# Patient Record
Sex: Female | Born: 1950 | Race: White | Hispanic: No | Marital: Married | Smoking: Former smoker
Health system: Southern US, Community
[De-identification: ages and names within clinical notes are randomized; demographics above are authoritative.]

## PROBLEM LIST (undated history)

## (undated) DIAGNOSIS — Z923 Personal history of irradiation: Secondary | ICD-10-CM

## (undated) DIAGNOSIS — C439 Malignant melanoma of skin, unspecified: Secondary | ICD-10-CM

## (undated) DIAGNOSIS — M1990 Unspecified osteoarthritis, unspecified site: Secondary | ICD-10-CM

## (undated) DIAGNOSIS — E785 Hyperlipidemia, unspecified: Secondary | ICD-10-CM

## (undated) DIAGNOSIS — K219 Gastro-esophageal reflux disease without esophagitis: Secondary | ICD-10-CM

## (undated) DIAGNOSIS — R42 Dizziness and giddiness: Secondary | ICD-10-CM

## (undated) DIAGNOSIS — Z9889 Other specified postprocedural states: Secondary | ICD-10-CM

## (undated) HISTORY — DX: Gastro-esophageal reflux disease without esophagitis: K21.9

## (undated) HISTORY — PX: ANAL FISSURE REPAIR: SHX2312

## (undated) HISTORY — PX: HEMORRHOIDECTOMY WITH HEMORRHOID BANDING: SHX5633

## (undated) HISTORY — DX: Unspecified osteoarthritis, unspecified site: M19.90

## (undated) HISTORY — PX: AUGMENTATION MAMMAPLASTY: SUR837

## (undated) HISTORY — DX: Malignant melanoma of skin, unspecified: C43.9

---

## 1997-09-01 ENCOUNTER — Other Ambulatory Visit: Admission: RE | Admit: 1997-09-01 | Discharge: 1997-09-01 | Payer: Self-pay | Admitting: Gynecology

## 1998-09-26 ENCOUNTER — Other Ambulatory Visit: Admission: RE | Admit: 1998-09-26 | Discharge: 1998-09-26 | Payer: Self-pay | Admitting: Gynecology

## 1998-10-19 ENCOUNTER — Other Ambulatory Visit: Admission: RE | Admit: 1998-10-19 | Discharge: 1998-10-19 | Payer: Self-pay | Admitting: Gynecology

## 1998-11-13 ENCOUNTER — Ambulatory Visit (HOSPITAL_COMMUNITY): Admission: RE | Admit: 1998-11-13 | Discharge: 1998-11-13 | Payer: Self-pay | Admitting: Gynecology

## 1999-02-21 ENCOUNTER — Other Ambulatory Visit: Admission: RE | Admit: 1999-02-21 | Discharge: 1999-02-21 | Payer: Self-pay | Admitting: Gynecology

## 1999-05-11 ENCOUNTER — Encounter: Admission: RE | Admit: 1999-05-11 | Discharge: 1999-05-11 | Payer: Self-pay | Admitting: Gynecology

## 1999-05-11 ENCOUNTER — Encounter: Payer: Self-pay | Admitting: Gynecology

## 1999-08-20 ENCOUNTER — Other Ambulatory Visit: Admission: RE | Admit: 1999-08-20 | Discharge: 1999-08-20 | Payer: Self-pay | Admitting: Gynecology

## 2000-03-03 ENCOUNTER — Other Ambulatory Visit: Admission: RE | Admit: 2000-03-03 | Discharge: 2000-03-03 | Payer: Self-pay | Admitting: Gynecology

## 2000-05-12 ENCOUNTER — Encounter: Admission: RE | Admit: 2000-05-12 | Discharge: 2000-05-12 | Payer: Self-pay | Admitting: Gynecology

## 2000-05-12 ENCOUNTER — Encounter: Payer: Self-pay | Admitting: Gynecology

## 2001-02-17 ENCOUNTER — Other Ambulatory Visit: Admission: RE | Admit: 2001-02-17 | Discharge: 2001-02-17 | Payer: Self-pay | Admitting: Gynecology

## 2001-04-23 ENCOUNTER — Ambulatory Visit (HOSPITAL_COMMUNITY): Admission: RE | Admit: 2001-04-23 | Discharge: 2001-04-23 | Payer: Self-pay | Admitting: General Surgery

## 2001-04-23 ENCOUNTER — Encounter (INDEPENDENT_AMBULATORY_CARE_PROVIDER_SITE_OTHER): Payer: Self-pay | Admitting: Specialist

## 2001-05-29 ENCOUNTER — Encounter: Payer: Self-pay | Admitting: Gynecology

## 2001-05-29 ENCOUNTER — Encounter: Admission: RE | Admit: 2001-05-29 | Discharge: 2001-05-29 | Payer: Self-pay | Admitting: Gynecology

## 2001-06-15 ENCOUNTER — Encounter: Payer: Self-pay | Admitting: Family Medicine

## 2001-06-15 ENCOUNTER — Ambulatory Visit (HOSPITAL_COMMUNITY): Admission: RE | Admit: 2001-06-15 | Discharge: 2001-06-15 | Payer: Self-pay | Admitting: Family Medicine

## 2002-03-01 ENCOUNTER — Other Ambulatory Visit: Admission: RE | Admit: 2002-03-01 | Discharge: 2002-03-01 | Payer: Self-pay | Admitting: Gynecology

## 2002-05-31 ENCOUNTER — Encounter: Payer: Self-pay | Admitting: Gynecology

## 2002-05-31 ENCOUNTER — Encounter: Admission: RE | Admit: 2002-05-31 | Discharge: 2002-05-31 | Payer: Self-pay | Admitting: Gynecology

## 2003-03-03 ENCOUNTER — Other Ambulatory Visit: Admission: RE | Admit: 2003-03-03 | Discharge: 2003-03-03 | Payer: Self-pay | Admitting: Gynecology

## 2003-06-02 ENCOUNTER — Encounter: Admission: RE | Admit: 2003-06-02 | Discharge: 2003-06-02 | Payer: Self-pay | Admitting: Gynecology

## 2004-03-05 ENCOUNTER — Other Ambulatory Visit: Admission: RE | Admit: 2004-03-05 | Discharge: 2004-03-05 | Payer: Self-pay | Admitting: Gynecology

## 2004-06-14 ENCOUNTER — Ambulatory Visit (HOSPITAL_COMMUNITY): Admission: RE | Admit: 2004-06-14 | Discharge: 2004-06-14 | Payer: Self-pay | Admitting: Gynecology

## 2005-03-07 ENCOUNTER — Other Ambulatory Visit: Admission: RE | Admit: 2005-03-07 | Discharge: 2005-03-07 | Payer: Self-pay | Admitting: Gynecology

## 2005-07-17 ENCOUNTER — Ambulatory Visit (HOSPITAL_COMMUNITY): Admission: RE | Admit: 2005-07-17 | Discharge: 2005-07-17 | Payer: Self-pay | Admitting: Gynecology

## 2006-07-23 ENCOUNTER — Ambulatory Visit (HOSPITAL_COMMUNITY): Admission: RE | Admit: 2006-07-23 | Discharge: 2006-07-23 | Payer: Self-pay | Admitting: Gynecology

## 2007-01-21 ENCOUNTER — Encounter: Admission: RE | Admit: 2007-01-21 | Discharge: 2007-04-14 | Payer: Self-pay | Admitting: Family Medicine

## 2007-07-28 ENCOUNTER — Ambulatory Visit (HOSPITAL_COMMUNITY): Admission: RE | Admit: 2007-07-28 | Discharge: 2007-07-28 | Payer: Self-pay | Admitting: Gynecology

## 2007-08-07 ENCOUNTER — Encounter: Admission: RE | Admit: 2007-08-07 | Discharge: 2007-08-07 | Payer: Self-pay | Admitting: Gynecology

## 2008-07-28 ENCOUNTER — Encounter: Admission: RE | Admit: 2008-07-28 | Discharge: 2008-07-28 | Payer: Self-pay | Admitting: Gynecology

## 2008-08-05 ENCOUNTER — Emergency Department (HOSPITAL_BASED_OUTPATIENT_CLINIC_OR_DEPARTMENT_OTHER): Admission: EM | Admit: 2008-08-05 | Discharge: 2008-08-05 | Payer: Self-pay | Admitting: Emergency Medicine

## 2008-08-05 ENCOUNTER — Ambulatory Visit: Payer: Self-pay | Admitting: Diagnostic Radiology

## 2008-08-15 ENCOUNTER — Encounter: Admission: RE | Admit: 2008-08-15 | Discharge: 2008-08-15 | Payer: Self-pay | Admitting: Family Medicine

## 2008-09-06 ENCOUNTER — Encounter: Admission: RE | Admit: 2008-09-06 | Discharge: 2008-09-06 | Payer: Self-pay | Admitting: Family Medicine

## 2009-08-01 ENCOUNTER — Encounter: Admission: RE | Admit: 2009-08-01 | Discharge: 2009-08-01 | Payer: Self-pay | Admitting: Gynecology

## 2010-06-10 DIAGNOSIS — C439 Malignant melanoma of skin, unspecified: Secondary | ICD-10-CM

## 2010-06-10 HISTORY — DX: Malignant melanoma of skin, unspecified: C43.9

## 2010-07-12 ENCOUNTER — Other Ambulatory Visit: Payer: Self-pay | Admitting: Gynecology

## 2010-07-12 DIAGNOSIS — Z1239 Encounter for other screening for malignant neoplasm of breast: Secondary | ICD-10-CM

## 2010-07-13 ENCOUNTER — Other Ambulatory Visit: Payer: Self-pay | Admitting: Dermatology

## 2010-08-03 ENCOUNTER — Ambulatory Visit
Admission: RE | Admit: 2010-08-03 | Discharge: 2010-08-03 | Disposition: A | Discharge status: Home / Self Care | Payer: PRIVATE HEALTH INSURANCE | Source: Ambulatory Visit | Attending: Gynecology | Admitting: Gynecology

## 2010-08-03 ENCOUNTER — Ambulatory Visit: Payer: Self-pay

## 2010-08-03 DIAGNOSIS — Z1239 Encounter for other screening for malignant neoplasm of breast: Secondary | ICD-10-CM

## 2010-08-09 ENCOUNTER — Other Ambulatory Visit: Payer: Self-pay | Admitting: Dermatology

## 2010-09-25 LAB — CBC
Hemoglobin: 13.5 g/dL (ref 12.0–15.0)
MCHC: 34 g/dL (ref 30.0–36.0)
RBC: 4.36 MIL/uL (ref 3.87–5.11)
RDW: 11.9 % (ref 11.5–15.5)
WBC: 5.2 10*3/uL (ref 4.0–10.5)

## 2010-09-25 LAB — D-DIMER, QUANTITATIVE: D-Dimer, Quant: <0.22 ug/mL-FEU (ref 0.00–0.48)

## 2010-09-25 LAB — COMPREHENSIVE METABOLIC PANEL
ALT: 23 U/L (ref 0–35)
AST: 24 U/L (ref 0–37)
Chloride: 103 mEq/L (ref 96–112)
Creatinine, Ser: 0.9 mg/dL (ref 0.4–1.2)
GFR calc non Af Amer: >60 mL/min (ref >60)
Glucose, Bld: 95 mg/dL (ref 70–99)
Potassium: 3.8 mEq/L (ref 3.5–5.1)
Sodium: 141 mEq/L (ref 135–145)
Total Bilirubin: 0.8 mg/dL (ref 0.3–1.2)

## 2010-09-25 LAB — POCT CARDIAC MARKERS
Myoglobin, poc: 81.2 ng/mL (ref 12–200)
Troponin i, poc: 0.06 ng/mL (ref 0.00–0.09)

## 2010-09-25 LAB — DIFFERENTIAL
Lymphocytes Relative: 34 % (ref 12–46)
Monocytes Relative: 7 % (ref 3–12)
Neutrophils Relative %: 58 % (ref 43–77)

## 2010-09-28 ENCOUNTER — Other Ambulatory Visit: Payer: Self-pay | Admitting: Dermatology

## 2010-10-26 NOTE — Op Note (Signed)
[facility]  Patient:    Diane Obrien, Diane Obrien Visit Number: [id number] MRN: [id number]          Service Type: DSU Location: DAY Attending Physician:  Carson Myrtle Dictated by:   Sheppard Plumber Earlene Plater, M.D. Proc. Date: 04/23/01 Admit Date:  04/23/2001 Discharge Date: 04/23/2001                             Operative Report  PREOPERATIVE DIAGNOSIS:  History of anal fissure, external anal tags.  POSTOPERATIVE DIAGNOSIS:  Anal fissure, external tags.  OPERATION PERFORMED:  Repair of anal fissure and excision of tags.  SURGEON:  Timothy E. Earlene Plater, M.D.  ANESTHESIA:  General.  INDICATIONS FOR PROCEDURE:  Ms. Elizondo is a patient I have seen and followed for many years.  She has previously had excision of polyps and repair of anal fissure.  She presented approximately a month ago with recurrence of the fissure and exaggeration of the external tags.  She has been treated conservatively without much benefit.  She is ready now for definitive procedure.  She agrees and understands.  DESCRIPTION OF PROCEDURE:  The patient was taken to the operating room and placed supine.  General mask anesthesia provided.  She was placed in lithotomy position, perianal area inspected, prepped and draped.  A large right anterior external tag was present.  A larger posterior tag was present and in it lay a posterior anal fissure.  The anal orifice was adequate but small.  Sphincters were intact.  The anus was injected round and about with 0.25% Marcaine with epinephrine mixed 9:1 with Wydase.  This was massaged in well.  The external tags were carefully removed.  A left posterior internal sphincterotomy carefully accomplished percutaneously and the external sphincter was left intact.  The fissure was cauterized and the site of tag  excision was closed with running or interrupted 3-0 chromic.  This completed the procedure.  There were no complications and no bleeding.  Gelfoam  gauze and a dry sterile dressing applied.  She tolerated it well, was awakened and taken to the recovery room in good condition.  Written and verbal instructions were given including Percocet and she will be followed as an outpatient. Dictated by:   Sheppard Plumber Earlene Plater, M.D. Attending Physician:  Carson Myrtle DD:  04/23/01 TD:  04/23/01 Job: [id number]

## 2011-03-05 ENCOUNTER — Other Ambulatory Visit: Payer: Self-pay | Admitting: Gynecology

## 2011-08-23 ENCOUNTER — Other Ambulatory Visit: Payer: Self-pay | Admitting: Gynecology

## 2011-08-23 DIAGNOSIS — Z1231 Encounter for screening mammogram for malignant neoplasm of breast: Secondary | ICD-10-CM

## 2011-09-04 ENCOUNTER — Ambulatory Visit
Admission: RE | Admit: 2011-09-04 | Discharge: 2011-09-04 | Disposition: A | Discharge status: Home / Self Care | Payer: Federal, State, Local not specified - PPO | Source: Ambulatory Visit | Attending: Gynecology | Admitting: Gynecology

## 2011-09-04 DIAGNOSIS — Z1231 Encounter for screening mammogram for malignant neoplasm of breast: Secondary | ICD-10-CM

## 2012-04-07 ENCOUNTER — Encounter: Payer: Self-pay | Admitting: Gastroenterology

## 2012-05-04 ENCOUNTER — Other Ambulatory Visit: Payer: Self-pay | Admitting: Gynecology

## 2012-05-10 HISTORY — PX: COLONOSCOPY: SHX174

## 2012-05-14 ENCOUNTER — Ambulatory Visit (AMBULATORY_SURGERY_CENTER): Payer: Federal, State, Local not specified - PPO | Admitting: *Deleted

## 2012-05-14 ENCOUNTER — Encounter: Payer: Self-pay | Admitting: Gastroenterology

## 2012-05-14 VITALS — Ht 64.0 in | Wt 159.2 lb

## 2012-05-14 DIAGNOSIS — Z1211 Encounter for screening for malignant neoplasm of colon: Secondary | ICD-10-CM

## 2012-05-14 MED ORDER — NA SULFATE-K SULFATE-MG SULF 17.5-3.13-1.6 GM/177ML PO SOLN: ORAL | Status: DC

## 2012-05-28 ENCOUNTER — Encounter: Payer: Self-pay | Admitting: Gastroenterology

## 2012-05-28 ENCOUNTER — Ambulatory Visit (AMBULATORY_SURGERY_CENTER): Payer: Federal, State, Local not specified - PPO | Admitting: Gastroenterology

## 2012-05-28 VITALS — BP 143/78 | HR 73 | Temp 97.3°F | Resp 15 | Ht 64.0 in | Wt 159.0 lb

## 2012-05-28 DIAGNOSIS — Z1211 Encounter for screening for malignant neoplasm of colon: Secondary | ICD-10-CM

## 2012-05-28 DIAGNOSIS — K648 Other hemorrhoids: Secondary | ICD-10-CM

## 2012-05-28 MED ORDER — SODIUM CHLORIDE 0.9 % IV SOLN: 500.0000 mL | INTRAVENOUS | Status: DC

## 2012-05-28 NOTE — Patient Instructions (Addendum)

## 2012-05-28 NOTE — Progress Notes (Addendum)
Patient did not have preoperative order for IV antibiotic SSI prophylaxis. (G8918)  Patient did not experience any of the following events: a burn prior to discharge; a fall within the facility; wrong site/side/patient/procedure/implant event; or a hospital transfer or hospital admission upon discharge from the facility. (G8907)  

## 2012-05-28 NOTE — Progress Notes (Signed)
Propofol given over incremental dosages 

## 2012-05-28 NOTE — Op Note (Signed)
[facility] [address] [place], [address]   COLONOSCOPY PROCEDURE REPORT  PATIENT: Diane, Obrien.  MR#: [id number] BIRTHDATE: 24-Jan-1951 , 61  yrs. old GENDER: Female ENDOSCOPIST: Louis Meckel, MD REFERRED XB:JYNWG Swayne, M.D. PROCEDURE DATE:  05/28/2012 PROCEDURE:   Colonoscopy, diagnostic ASA CLASS:   Class II INDICATIONS:average risk screening. MEDICATIONS: MAC sedation, administered by CRNA and propofol (Diprivan) 100mg  IV  DESCRIPTION OF PROCEDURE:   After the risks benefits and alternatives of the procedure were thoroughly explained, informed consent was obtained.  A digital rectal exam revealed internal hemorrhoids.   The LB [id number]  endoscope was introduced through the anus and advanced to the cecum, which was identified by both the appendix and ileocecal valve. No adverse events experienced.   The quality of the prep was Suprep excellent  The instrument was then slowly withdrawn as the colon was fully examined.      COLON FINDINGS: Internal hemorrhoids were found.   The colon mucosa was otherwise normal.  Retroflexed views revealed no abnormalities. The time to cecum=2 minutes 58 seconds.  Withdrawal time=6 minutes 0 seconds.  The scope was withdrawn and the procedure completed. COMPLICATIONS: There were no complications.  ENDOSCOPIC IMPRESSION: 1.   Internal hemorrhoids 2.   The colon mucosa was otherwise normal  RECOMMENDATIONS: Continue current colorectal screening recommendations for "routine risk" patients with a repeat colonoscopy in 10 years.   eSigned:  Louis Meckel, MD 05/28/2012 9:26 AM   cc:

## 2012-05-29 ENCOUNTER — Telehealth: Payer: Self-pay | Admitting: *Deleted

## 2012-05-29 NOTE — Telephone Encounter (Signed)
  Follow up Call-  Call back number 05/28/2012  Post procedure Call Back phone  # [phone]  Permission to leave phone message Yes     Patient questions:  Do you have a fever, pain , or abdominal swelling? no Pain Score  0 *  Have you tolerated food without any problems? yes  Have you been able to return to your normal activities? yes  Do you have any questions about your discharge instructions: Diet   no Medications  no Follow up visit  no  Do you have questions or concerns about your Care? no  Actions: * If pain score is 4 or above: No action needed, pain <4.

## 2012-08-13 ENCOUNTER — Other Ambulatory Visit: Payer: Self-pay

## 2012-09-08 ENCOUNTER — Ambulatory Visit
Admission: RE | Admit: 2012-09-08 | Discharge: 2012-09-08 | Disposition: A | Discharge status: Home / Self Care | Payer: Federal, State, Local not specified - PPO | Source: Ambulatory Visit

## 2012-09-08 DIAGNOSIS — Z1231 Encounter for screening mammogram for malignant neoplasm of breast: Secondary | ICD-10-CM

## 2013-02-02 ENCOUNTER — Other Ambulatory Visit: Payer: Self-pay | Admitting: Dermatology

## 2013-05-17 ENCOUNTER — Other Ambulatory Visit: Payer: Self-pay | Admitting: Gynecology

## 2013-08-04 ENCOUNTER — Other Ambulatory Visit: Payer: Self-pay | Admitting: Dermatology

## 2013-09-07 ENCOUNTER — Other Ambulatory Visit: Payer: Self-pay | Admitting: Dermatology

## 2013-09-10 ENCOUNTER — Other Ambulatory Visit: Payer: Self-pay

## 2013-09-10 DIAGNOSIS — Z1231 Encounter for screening mammogram for malignant neoplasm of breast: Secondary | ICD-10-CM

## 2013-09-23 ENCOUNTER — Ambulatory Visit
Admission: RE | Admit: 2013-09-23 | Discharge: 2013-09-23 | Disposition: A | Discharge status: Home / Self Care | Payer: Federal, State, Local not specified - PPO | Source: Ambulatory Visit

## 2013-09-23 DIAGNOSIS — Z1231 Encounter for screening mammogram for malignant neoplasm of breast: Secondary | ICD-10-CM

## 2014-09-14 ENCOUNTER — Other Ambulatory Visit: Payer: Self-pay

## 2014-09-14 DIAGNOSIS — Z1231 Encounter for screening mammogram for malignant neoplasm of breast: Secondary | ICD-10-CM

## 2014-10-11 ENCOUNTER — Encounter (INDEPENDENT_AMBULATORY_CARE_PROVIDER_SITE_OTHER): Payer: Self-pay

## 2014-10-11 ENCOUNTER — Ambulatory Visit
Admission: RE | Admit: 2014-10-11 | Discharge: 2014-10-11 | Disposition: A | Discharge status: Home / Self Care | Payer: Federal, State, Local not specified - PPO | Source: Ambulatory Visit

## 2014-10-11 ENCOUNTER — Other Ambulatory Visit: Payer: Self-pay

## 2014-10-11 DIAGNOSIS — Z1231 Encounter for screening mammogram for malignant neoplasm of breast: Secondary | ICD-10-CM

## 2015-07-27 DIAGNOSIS — M9903 Segmental and somatic dysfunction of lumbar region: Secondary | ICD-10-CM | POA: Diagnosis not present

## 2015-07-27 DIAGNOSIS — M9901 Segmental and somatic dysfunction of cervical region: Secondary | ICD-10-CM | POA: Diagnosis not present

## 2015-07-27 DIAGNOSIS — M5383 Other specified dorsopathies, cervicothoracic region: Secondary | ICD-10-CM | POA: Diagnosis not present

## 2015-07-27 DIAGNOSIS — M9902 Segmental and somatic dysfunction of thoracic region: Secondary | ICD-10-CM | POA: Diagnosis not present

## 2015-07-27 DIAGNOSIS — M5033 Other cervical disc degeneration, cervicothoracic region: Secondary | ICD-10-CM | POA: Diagnosis not present

## 2015-07-27 DIAGNOSIS — M5116 Intervertebral disc disorders with radiculopathy, lumbar region: Secondary | ICD-10-CM | POA: Diagnosis not present

## 2015-08-10 DIAGNOSIS — Z0000 Encounter for general adult medical examination without abnormal findings: Secondary | ICD-10-CM | POA: Diagnosis not present

## 2015-09-22 DIAGNOSIS — J0100 Acute maxillary sinusitis, unspecified: Secondary | ICD-10-CM | POA: Diagnosis not present

## 2015-09-26 ENCOUNTER — Other Ambulatory Visit: Payer: Self-pay

## 2015-09-26 DIAGNOSIS — Z1231 Encounter for screening mammogram for malignant neoplasm of breast: Secondary | ICD-10-CM

## 2015-10-05 ENCOUNTER — Other Ambulatory Visit: Payer: Self-pay | Admitting: Family Medicine

## 2015-10-05 DIAGNOSIS — R9389 Abnormal findings on diagnostic imaging of other specified body structures: Secondary | ICD-10-CM

## 2015-10-05 DIAGNOSIS — R030 Elevated blood-pressure reading, without diagnosis of hypertension: Secondary | ICD-10-CM | POA: Diagnosis not present

## 2015-10-05 DIAGNOSIS — R938 Abnormal findings on diagnostic imaging of other specified body structures: Secondary | ICD-10-CM | POA: Diagnosis not present

## 2015-10-10 ENCOUNTER — Ambulatory Visit
Admission: RE | Admit: 2015-10-10 | Discharge: 2015-10-10 | Disposition: A | Discharge status: Home / Self Care | Payer: Medicare Other | Source: Ambulatory Visit | Attending: Family Medicine | Admitting: Family Medicine

## 2015-10-10 DIAGNOSIS — Z0389 Encounter for observation for other suspected diseases and conditions ruled out: Secondary | ICD-10-CM | POA: Diagnosis not present

## 2015-10-10 DIAGNOSIS — R9389 Abnormal findings on diagnostic imaging of other specified body structures: Secondary | ICD-10-CM

## 2015-10-26 ENCOUNTER — Ambulatory Visit
Admission: RE | Admit: 2015-10-26 | Discharge: 2015-10-26 | Disposition: A | Discharge status: Home / Self Care | Payer: Federal, State, Local not specified - PPO | Source: Ambulatory Visit

## 2015-10-26 ENCOUNTER — Ambulatory Visit: Payer: Federal, State, Local not specified - PPO

## 2015-10-26 DIAGNOSIS — Z1231 Encounter for screening mammogram for malignant neoplasm of breast: Secondary | ICD-10-CM | POA: Diagnosis not present

## 2015-11-07 DIAGNOSIS — D2272 Melanocytic nevi of left lower limb, including hip: Secondary | ICD-10-CM | POA: Diagnosis not present

## 2015-11-07 DIAGNOSIS — Z86018 Personal history of other benign neoplasm: Secondary | ICD-10-CM | POA: Diagnosis not present

## 2015-11-07 DIAGNOSIS — D2271 Melanocytic nevi of right lower limb, including hip: Secondary | ICD-10-CM | POA: Diagnosis not present

## 2015-11-07 DIAGNOSIS — Z87898 Personal history of other specified conditions: Secondary | ICD-10-CM | POA: Diagnosis not present

## 2015-11-07 DIAGNOSIS — D225 Melanocytic nevi of trunk: Secondary | ICD-10-CM | POA: Diagnosis not present

## 2015-11-07 DIAGNOSIS — Z808 Family history of malignant neoplasm of other organs or systems: Secondary | ICD-10-CM | POA: Diagnosis not present

## 2015-11-07 DIAGNOSIS — D2262 Melanocytic nevi of left upper limb, including shoulder: Secondary | ICD-10-CM | POA: Diagnosis not present

## 2015-11-07 DIAGNOSIS — Z85828 Personal history of other malignant neoplasm of skin: Secondary | ICD-10-CM | POA: Diagnosis not present

## 2015-11-07 DIAGNOSIS — D485 Neoplasm of uncertain behavior of skin: Secondary | ICD-10-CM | POA: Diagnosis not present

## 2015-11-22 DIAGNOSIS — M5033 Other cervical disc degeneration, cervicothoracic region: Secondary | ICD-10-CM | POA: Diagnosis not present

## 2015-11-22 DIAGNOSIS — M9903 Segmental and somatic dysfunction of lumbar region: Secondary | ICD-10-CM | POA: Diagnosis not present

## 2015-11-22 DIAGNOSIS — M9902 Segmental and somatic dysfunction of thoracic region: Secondary | ICD-10-CM | POA: Diagnosis not present

## 2015-11-22 DIAGNOSIS — M9901 Segmental and somatic dysfunction of cervical region: Secondary | ICD-10-CM | POA: Diagnosis not present

## 2015-11-22 DIAGNOSIS — M5116 Intervertebral disc disorders with radiculopathy, lumbar region: Secondary | ICD-10-CM | POA: Diagnosis not present

## 2015-11-22 DIAGNOSIS — M5383 Other specified dorsopathies, cervicothoracic region: Secondary | ICD-10-CM | POA: Diagnosis not present

## 2015-12-05 ENCOUNTER — Other Ambulatory Visit: Payer: Self-pay | Admitting: Obstetrics and Gynecology

## 2015-12-05 DIAGNOSIS — Z124 Encounter for screening for malignant neoplasm of cervix: Secondary | ICD-10-CM | POA: Diagnosis not present

## 2015-12-07 LAB — CYTOLOGY - PAP

## 2016-02-23 DIAGNOSIS — Z0000 Encounter for general adult medical examination without abnormal findings: Secondary | ICD-10-CM | POA: Diagnosis not present

## 2016-02-23 DIAGNOSIS — Z1211 Encounter for screening for malignant neoplasm of colon: Secondary | ICD-10-CM | POA: Diagnosis not present

## 2016-02-23 DIAGNOSIS — Z23 Encounter for immunization: Secondary | ICD-10-CM | POA: Diagnosis not present

## 2016-02-23 DIAGNOSIS — Z87898 Personal history of other specified conditions: Secondary | ICD-10-CM | POA: Diagnosis not present

## 2016-02-23 DIAGNOSIS — K219 Gastro-esophageal reflux disease without esophagitis: Secondary | ICD-10-CM | POA: Diagnosis not present

## 2016-02-23 DIAGNOSIS — E782 Mixed hyperlipidemia: Secondary | ICD-10-CM | POA: Diagnosis not present

## 2016-02-23 DIAGNOSIS — E042 Nontoxic multinodular goiter: Secondary | ICD-10-CM | POA: Diagnosis not present

## 2016-02-23 DIAGNOSIS — Z1389 Encounter for screening for other disorder: Secondary | ICD-10-CM | POA: Diagnosis not present

## 2016-02-23 DIAGNOSIS — Z85820 Personal history of malignant melanoma of skin: Secondary | ICD-10-CM | POA: Diagnosis not present

## 2016-02-23 DIAGNOSIS — R030 Elevated blood-pressure reading, without diagnosis of hypertension: Secondary | ICD-10-CM | POA: Diagnosis not present

## 2016-03-13 DIAGNOSIS — M5116 Intervertebral disc disorders with radiculopathy, lumbar region: Secondary | ICD-10-CM | POA: Diagnosis not present

## 2016-03-13 DIAGNOSIS — M9902 Segmental and somatic dysfunction of thoracic region: Secondary | ICD-10-CM | POA: Diagnosis not present

## 2016-03-13 DIAGNOSIS — M5383 Other specified dorsopathies, cervicothoracic region: Secondary | ICD-10-CM | POA: Diagnosis not present

## 2016-03-13 DIAGNOSIS — M5033 Other cervical disc degeneration, cervicothoracic region: Secondary | ICD-10-CM | POA: Diagnosis not present

## 2016-03-13 DIAGNOSIS — M9901 Segmental and somatic dysfunction of cervical region: Secondary | ICD-10-CM | POA: Diagnosis not present

## 2016-03-13 DIAGNOSIS — M9903 Segmental and somatic dysfunction of lumbar region: Secondary | ICD-10-CM | POA: Diagnosis not present

## 2016-05-29 DIAGNOSIS — Z86018 Personal history of other benign neoplasm: Secondary | ICD-10-CM | POA: Diagnosis not present

## 2016-05-29 DIAGNOSIS — D2271 Melanocytic nevi of right lower limb, including hip: Secondary | ICD-10-CM | POA: Diagnosis not present

## 2016-05-29 DIAGNOSIS — D2272 Melanocytic nevi of left lower limb, including hip: Secondary | ICD-10-CM | POA: Diagnosis not present

## 2016-05-29 DIAGNOSIS — Z87898 Personal history of other specified conditions: Secondary | ICD-10-CM | POA: Diagnosis not present

## 2016-05-29 DIAGNOSIS — L814 Other melanin hyperpigmentation: Secondary | ICD-10-CM | POA: Diagnosis not present

## 2016-05-29 DIAGNOSIS — Z85828 Personal history of other malignant neoplasm of skin: Secondary | ICD-10-CM | POA: Diagnosis not present

## 2016-05-29 DIAGNOSIS — L821 Other seborrheic keratosis: Secondary | ICD-10-CM | POA: Diagnosis not present

## 2016-05-29 DIAGNOSIS — Z23 Encounter for immunization: Secondary | ICD-10-CM | POA: Diagnosis not present

## 2016-05-29 DIAGNOSIS — D224 Melanocytic nevi of scalp and neck: Secondary | ICD-10-CM | POA: Diagnosis not present

## 2016-05-29 DIAGNOSIS — D485 Neoplasm of uncertain behavior of skin: Secondary | ICD-10-CM | POA: Diagnosis not present

## 2016-05-29 DIAGNOSIS — D225 Melanocytic nevi of trunk: Secondary | ICD-10-CM | POA: Diagnosis not present

## 2016-06-13 DIAGNOSIS — H2513 Age-related nuclear cataract, bilateral: Secondary | ICD-10-CM | POA: Diagnosis not present

## 2016-06-13 DIAGNOSIS — H43813 Vitreous degeneration, bilateral: Secondary | ICD-10-CM | POA: Diagnosis not present

## 2016-06-13 DIAGNOSIS — H35363 Drusen (degenerative) of macula, bilateral: Secondary | ICD-10-CM | POA: Diagnosis not present

## 2016-06-13 DIAGNOSIS — H5202 Hypermetropia, left eye: Secondary | ICD-10-CM | POA: Diagnosis not present

## 2016-08-21 DIAGNOSIS — J019 Acute sinusitis, unspecified: Secondary | ICD-10-CM | POA: Diagnosis not present

## 2016-10-11 ENCOUNTER — Other Ambulatory Visit: Payer: Self-pay | Admitting: Family Medicine

## 2016-10-11 DIAGNOSIS — E049 Nontoxic goiter, unspecified: Secondary | ICD-10-CM

## 2016-10-28 ENCOUNTER — Ambulatory Visit
Admission: RE | Admit: 2016-10-28 | Discharge: 2016-10-28 | Disposition: A | Discharge status: Home / Self Care | Payer: Federal, State, Local not specified - PPO | Source: Ambulatory Visit | Attending: Family Medicine | Admitting: Family Medicine

## 2016-10-28 DIAGNOSIS — E049 Nontoxic goiter, unspecified: Secondary | ICD-10-CM | POA: Diagnosis not present

## 2016-12-03 DIAGNOSIS — Z86018 Personal history of other benign neoplasm: Secondary | ICD-10-CM | POA: Diagnosis not present

## 2016-12-03 DIAGNOSIS — Z808 Family history of malignant neoplasm of other organs or systems: Secondary | ICD-10-CM | POA: Diagnosis not present

## 2016-12-03 DIAGNOSIS — Z87898 Personal history of other specified conditions: Secondary | ICD-10-CM | POA: Diagnosis not present

## 2016-12-03 DIAGNOSIS — D2271 Melanocytic nevi of right lower limb, including hip: Secondary | ICD-10-CM | POA: Diagnosis not present

## 2016-12-03 DIAGNOSIS — D225 Melanocytic nevi of trunk: Secondary | ICD-10-CM | POA: Diagnosis not present

## 2016-12-03 DIAGNOSIS — D2262 Melanocytic nevi of left upper limb, including shoulder: Secondary | ICD-10-CM | POA: Diagnosis not present

## 2016-12-03 DIAGNOSIS — Z85828 Personal history of other malignant neoplasm of skin: Secondary | ICD-10-CM | POA: Diagnosis not present

## 2016-12-03 DIAGNOSIS — L821 Other seborrheic keratosis: Secondary | ICD-10-CM | POA: Diagnosis not present

## 2016-12-03 DIAGNOSIS — D2272 Melanocytic nevi of left lower limb, including hip: Secondary | ICD-10-CM | POA: Diagnosis not present

## 2016-12-06 DIAGNOSIS — Z1231 Encounter for screening mammogram for malignant neoplasm of breast: Secondary | ICD-10-CM | POA: Diagnosis not present

## 2016-12-06 DIAGNOSIS — Z803 Family history of malignant neoplasm of breast: Secondary | ICD-10-CM | POA: Diagnosis not present

## 2016-12-10 IMAGING — US US THYROID
1 series · 13 of 25 positions shown · non-contrast
Comparison: None.

CLINICAL DATA: Questionable thyroid nodule palpated during life
time screening examination.

EXAM:
THYROID ULTRASOUND
TECHNIQUE: Ultrasound examination of the thyroid gland and adjacent soft
tissues was performed.

[Series 1: us thyroid · 0.06mm/px · 13 of 64 slices shown]
[im 1/64]
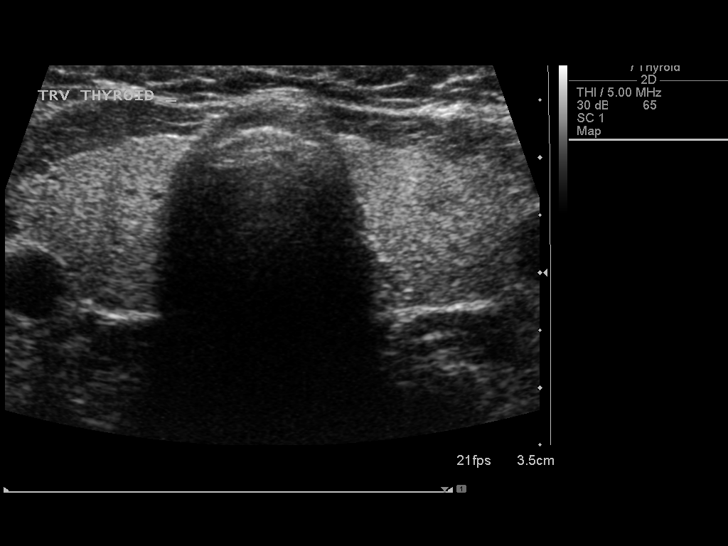
[im 6/64]
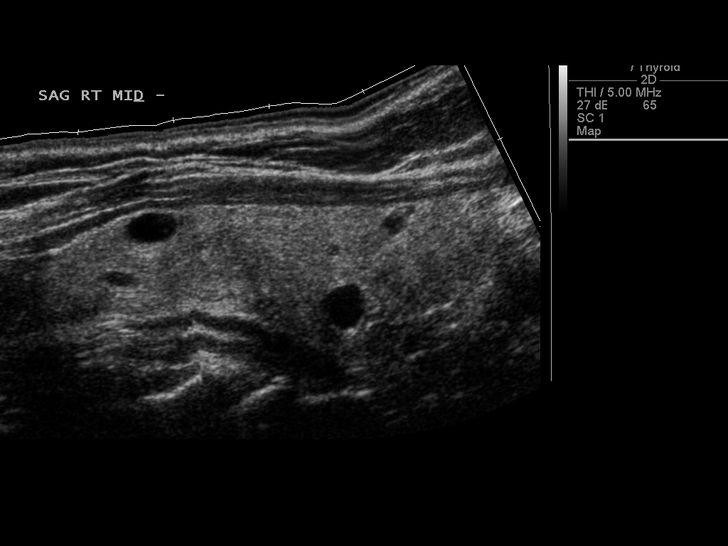
[im 11/64]
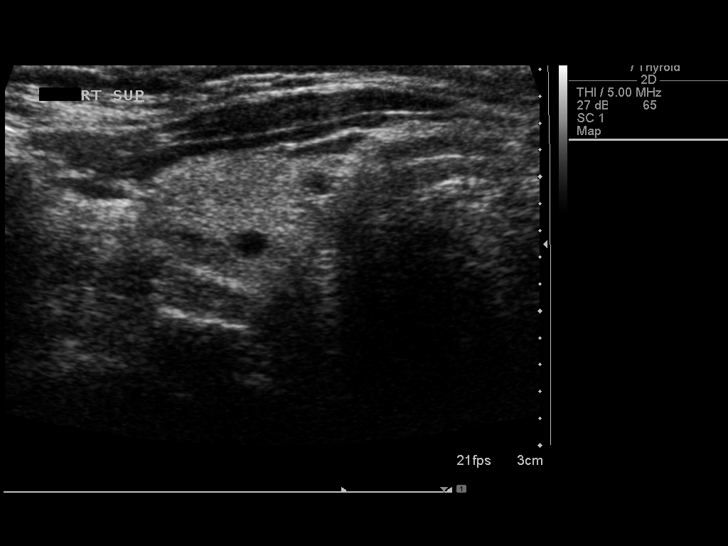
[im 16/64]
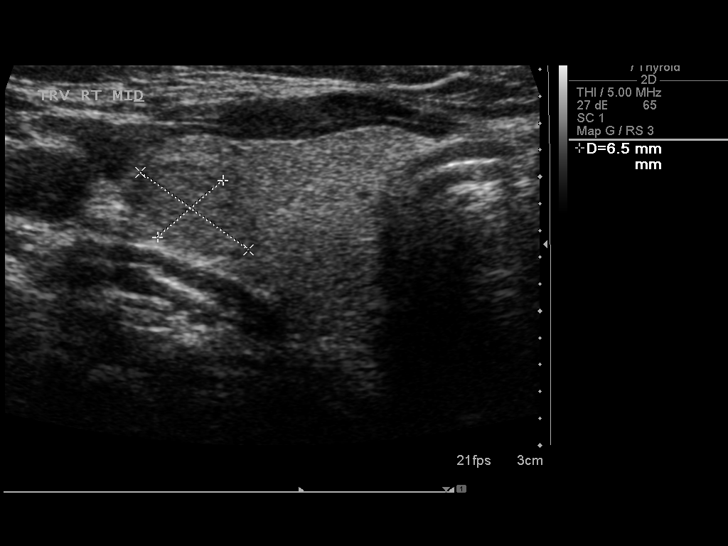
[im 22/64]
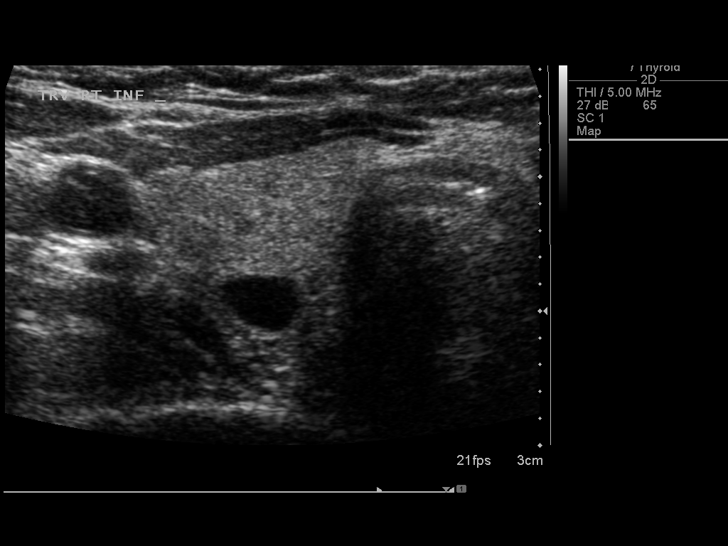
[im 27/64]
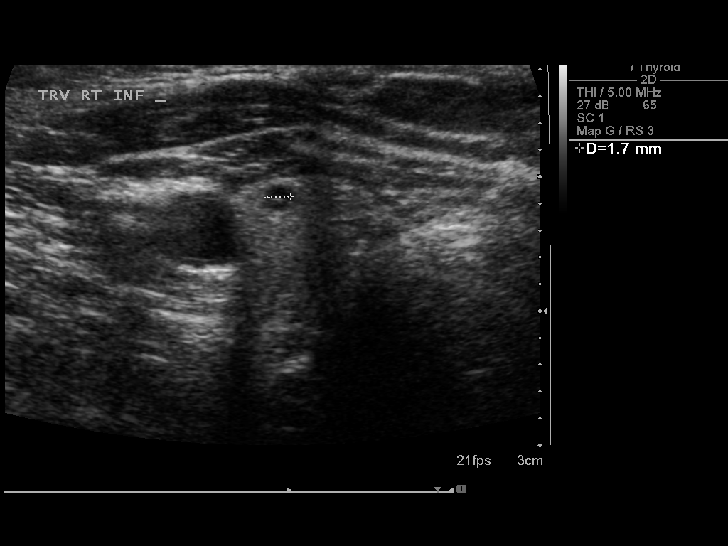
[im 32/64]
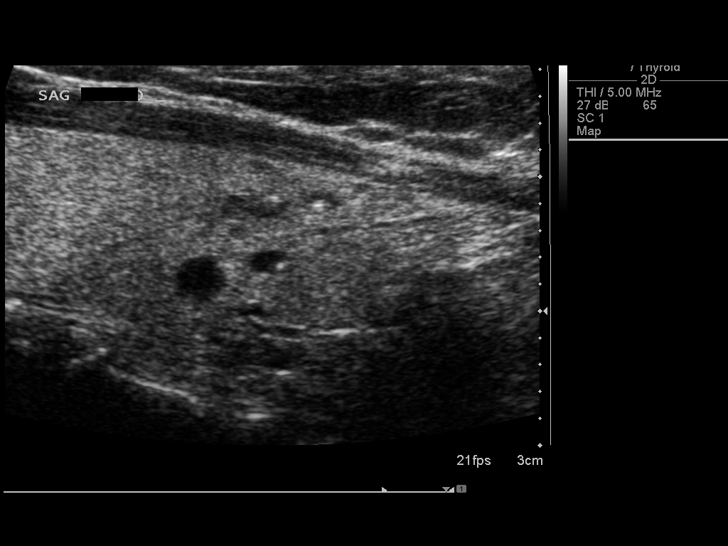
[im 37/64]
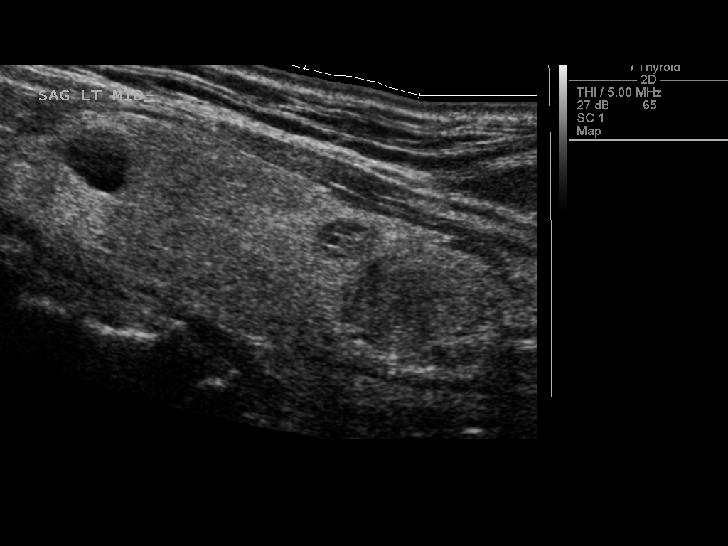
[im 43/64]
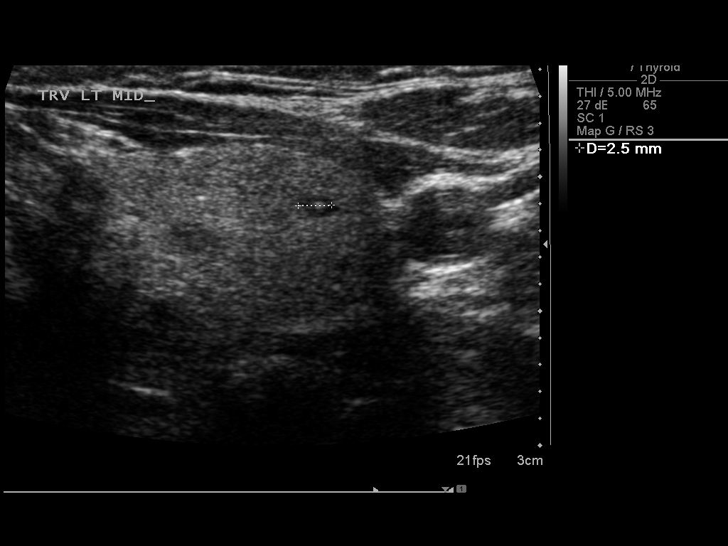
[im 48/64]
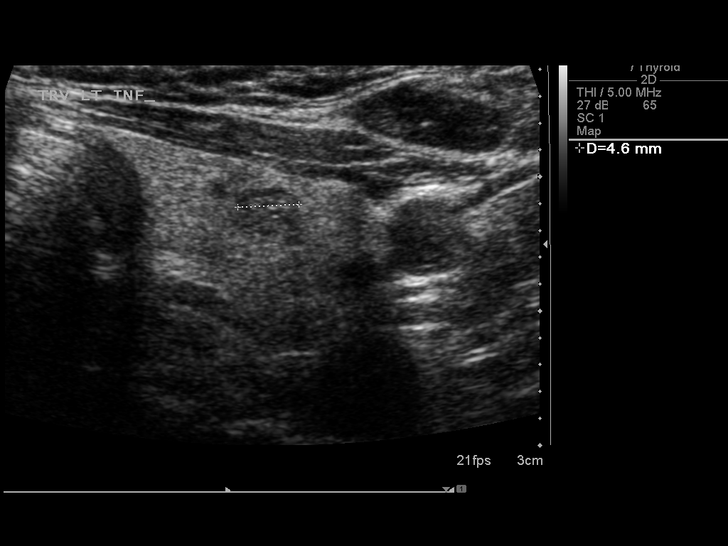
[im 53/64]
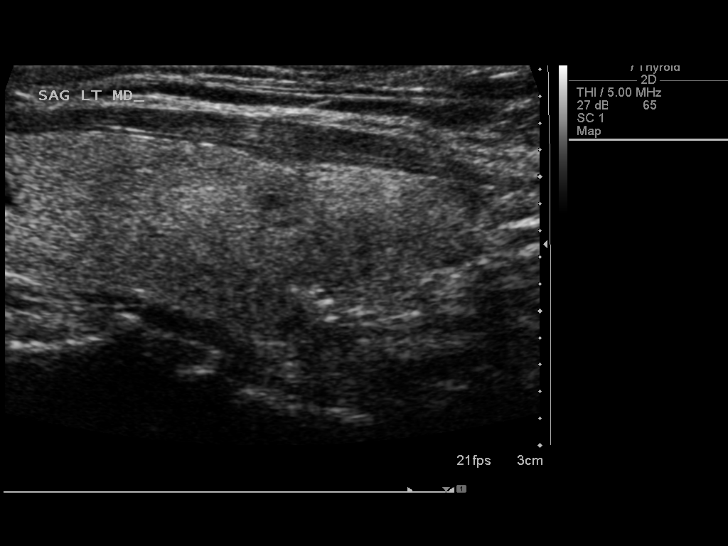
[im 58/64]
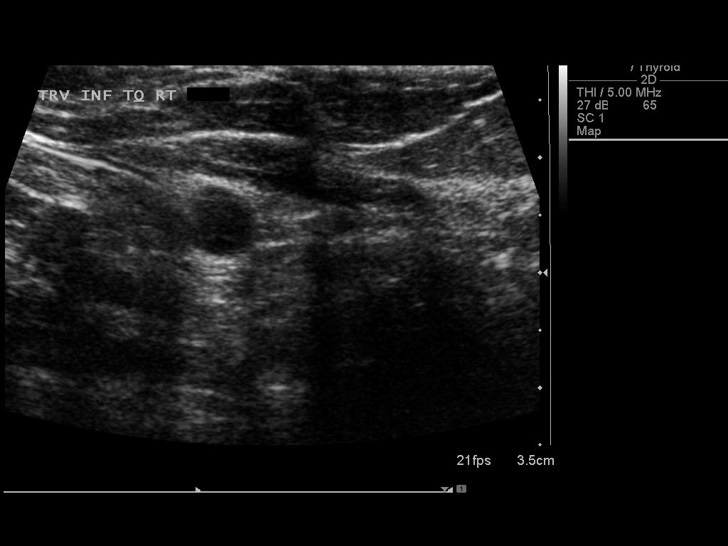
[im 64/64]
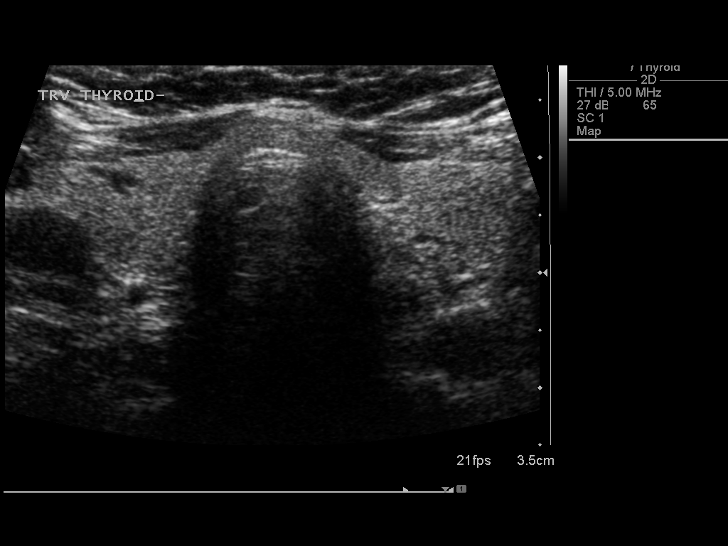

[13 of 25 positions shown; findings below may reference images not displayed]

FINDINGS: There is relative homogeneity the thyroid parenchymal echotexture.

Right thyroid lobe

Measurements: Normal in size measuring 5.1 x 1.5 x 1.4 cm.

Right, superior, posterior - 0.4 cm - hypoechoic, solid,
ill-defined, potentially a pseudo nodule.

Right, superior, anterior - 0.5 cm - anechoic, cystic.

Right, mid, lateral - 0.7 x 1.0 x 1.1 cm - mixed echogenic, solid

Right, inferior, posterior - 0.6 cm - anechoic, cystic.

There are several additional punctate sub 3 mm anechoic and cystic
nodules scattered within the remainder of the right lobe of the
thyroid several which demonstrate internal echogenic foci with ring
down artifact suggestive of colloid.

Left thyroid lobe

Measurements: Normal in size measuring 4.3 x 1.5 x 1.7 cm

Left, superior - 0.7 cm - anechoic, cystic.

Left, superior, lateral - 0.6 cm - hypoechoic, solid, ill-defined,
potentially pseudo nodule.

Left, inferior - 0.4 cm - hypoechoic, solid, ill-defined,
potentially pseudo nodule.

There are several additional punctate (sub 3 mm) nodules scattered
within the remainder of the left lobe of the thyroid..

Isthmus

Thickness: Normal in size measuring 0.3 cm in diameter.

No discrete nodule or mass identified within the thyroid isthmus.

Lymphadenopathy

None visualized.
IMPRESSION: Findings suggestive of multi nodular goiter. None of the discretely
measured thyroid nodules currently meet imaging criteria to
recommend percutaneous sampling. This recommendation follows the
consensus statement: Management of Thyroid Nodules Detected at US:
Society of Radiologists in Ultrasound Consensus Conference

## 2017-02-25 DIAGNOSIS — Z85820 Personal history of malignant melanoma of skin: Secondary | ICD-10-CM | POA: Diagnosis not present

## 2017-02-25 DIAGNOSIS — K219 Gastro-esophageal reflux disease without esophagitis: Secondary | ICD-10-CM | POA: Diagnosis not present

## 2017-02-25 DIAGNOSIS — Z1389 Encounter for screening for other disorder: Secondary | ICD-10-CM | POA: Diagnosis not present

## 2017-02-25 DIAGNOSIS — Z87898 Personal history of other specified conditions: Secondary | ICD-10-CM | POA: Diagnosis not present

## 2017-02-25 DIAGNOSIS — Z23 Encounter for immunization: Secondary | ICD-10-CM | POA: Diagnosis not present

## 2017-02-25 DIAGNOSIS — Z13820 Encounter for screening for osteoporosis: Secondary | ICD-10-CM | POA: Diagnosis not present

## 2017-02-25 DIAGNOSIS — Z1211 Encounter for screening for malignant neoplasm of colon: Secondary | ICD-10-CM | POA: Diagnosis not present

## 2017-02-25 DIAGNOSIS — R030 Elevated blood-pressure reading, without diagnosis of hypertension: Secondary | ICD-10-CM | POA: Diagnosis not present

## 2017-02-25 DIAGNOSIS — E042 Nontoxic multinodular goiter: Secondary | ICD-10-CM | POA: Diagnosis not present

## 2017-02-25 DIAGNOSIS — E782 Mixed hyperlipidemia: Secondary | ICD-10-CM | POA: Diagnosis not present

## 2017-02-25 DIAGNOSIS — Z1159 Encounter for screening for other viral diseases: Secondary | ICD-10-CM | POA: Diagnosis not present

## 2017-02-25 DIAGNOSIS — Z0000 Encounter for general adult medical examination without abnormal findings: Secondary | ICD-10-CM | POA: Diagnosis not present

## 2017-02-27 ENCOUNTER — Other Ambulatory Visit: Payer: Self-pay | Admitting: Family Medicine

## 2017-02-27 DIAGNOSIS — R5381 Other malaise: Secondary | ICD-10-CM

## 2017-02-28 ENCOUNTER — Other Ambulatory Visit: Payer: Self-pay | Admitting: Family Medicine

## 2017-02-28 DIAGNOSIS — E2839 Other primary ovarian failure: Secondary | ICD-10-CM

## 2017-04-01 ENCOUNTER — Other Ambulatory Visit: Payer: Medicare Other

## 2017-04-03 DIAGNOSIS — Z23 Encounter for immunization: Secondary | ICD-10-CM | POA: Diagnosis not present

## 2017-04-17 ENCOUNTER — Ambulatory Visit
Admission: RE | Admit: 2017-04-17 | Discharge: 2017-04-17 | Disposition: A | Discharge status: Home / Self Care | Payer: Federal, State, Local not specified - PPO | Source: Ambulatory Visit | Attending: Family Medicine | Admitting: Family Medicine

## 2017-04-17 DIAGNOSIS — Z13820 Encounter for screening for osteoporosis: Secondary | ICD-10-CM | POA: Diagnosis not present

## 2017-04-17 DIAGNOSIS — E2839 Other primary ovarian failure: Secondary | ICD-10-CM

## 2017-04-17 DIAGNOSIS — Z780 Asymptomatic menopausal state: Secondary | ICD-10-CM | POA: Diagnosis not present

## 2017-12-30 DIAGNOSIS — D2272 Melanocytic nevi of left lower limb, including hip: Secondary | ICD-10-CM | POA: Diagnosis not present

## 2017-12-30 DIAGNOSIS — L988 Other specified disorders of the skin and subcutaneous tissue: Secondary | ICD-10-CM | POA: Diagnosis not present

## 2017-12-30 DIAGNOSIS — D225 Melanocytic nevi of trunk: Secondary | ICD-10-CM | POA: Diagnosis not present

## 2017-12-30 DIAGNOSIS — D2262 Melanocytic nevi of left upper limb, including shoulder: Secondary | ICD-10-CM | POA: Diagnosis not present

## 2017-12-30 DIAGNOSIS — D485 Neoplasm of uncertain behavior of skin: Secondary | ICD-10-CM | POA: Diagnosis not present

## 2017-12-30 DIAGNOSIS — Z87898 Personal history of other specified conditions: Secondary | ICD-10-CM | POA: Diagnosis not present

## 2017-12-30 DIAGNOSIS — Z85828 Personal history of other malignant neoplasm of skin: Secondary | ICD-10-CM | POA: Diagnosis not present

## 2017-12-30 DIAGNOSIS — D2271 Melanocytic nevi of right lower limb, including hip: Secondary | ICD-10-CM | POA: Diagnosis not present

## 2017-12-30 DIAGNOSIS — Z808 Family history of malignant neoplasm of other organs or systems: Secondary | ICD-10-CM | POA: Diagnosis not present

## 2017-12-30 DIAGNOSIS — L821 Other seborrheic keratosis: Secondary | ICD-10-CM | POA: Diagnosis not present

## 2017-12-30 DIAGNOSIS — Z86018 Personal history of other benign neoplasm: Secondary | ICD-10-CM | POA: Diagnosis not present

## 2018-01-06 DIAGNOSIS — Z01419 Encounter for gynecological examination (general) (routine) without abnormal findings: Secondary | ICD-10-CM | POA: Diagnosis not present

## 2018-01-06 DIAGNOSIS — Z85820 Personal history of malignant melanoma of skin: Secondary | ICD-10-CM | POA: Diagnosis not present

## 2018-01-06 DIAGNOSIS — Z803 Family history of malignant neoplasm of breast: Secondary | ICD-10-CM | POA: Diagnosis not present

## 2018-01-06 DIAGNOSIS — Z8041 Family history of malignant neoplasm of ovary: Secondary | ICD-10-CM | POA: Diagnosis not present

## 2018-01-06 DIAGNOSIS — Z1231 Encounter for screening mammogram for malignant neoplasm of breast: Secondary | ICD-10-CM | POA: Diagnosis not present

## 2018-03-03 DIAGNOSIS — D485 Neoplasm of uncertain behavior of skin: Secondary | ICD-10-CM | POA: Diagnosis not present

## 2018-03-03 DIAGNOSIS — L988 Other specified disorders of the skin and subcutaneous tissue: Secondary | ICD-10-CM | POA: Diagnosis not present

## 2018-03-10 DIAGNOSIS — Z23 Encounter for immunization: Secondary | ICD-10-CM | POA: Diagnosis not present

## 2018-05-05 DIAGNOSIS — J019 Acute sinusitis, unspecified: Secondary | ICD-10-CM | POA: Diagnosis not present

## 2018-06-18 DIAGNOSIS — S0502XA Injury of conjunctiva and corneal abrasion without foreign body, left eye, initial encounter: Secondary | ICD-10-CM | POA: Diagnosis not present

## 2018-06-18 DIAGNOSIS — T1512XA Foreign body in conjunctival sac, left eye, initial encounter: Secondary | ICD-10-CM | POA: Diagnosis not present

## 2018-07-14 DIAGNOSIS — J019 Acute sinusitis, unspecified: Secondary | ICD-10-CM | POA: Diagnosis not present

## 2018-12-24 DIAGNOSIS — Z87898 Personal history of other specified conditions: Secondary | ICD-10-CM | POA: Diagnosis not present

## 2018-12-24 DIAGNOSIS — E782 Mixed hyperlipidemia: Secondary | ICD-10-CM | POA: Diagnosis not present

## 2018-12-24 DIAGNOSIS — L821 Other seborrheic keratosis: Secondary | ICD-10-CM | POA: Diagnosis not present

## 2018-12-24 DIAGNOSIS — Z808 Family history of malignant neoplasm of other organs or systems: Secondary | ICD-10-CM | POA: Diagnosis not present

## 2018-12-24 DIAGNOSIS — Z1211 Encounter for screening for malignant neoplasm of colon: Secondary | ICD-10-CM | POA: Diagnosis not present

## 2018-12-24 DIAGNOSIS — Z85828 Personal history of other malignant neoplasm of skin: Secondary | ICD-10-CM | POA: Diagnosis not present

## 2018-12-24 DIAGNOSIS — K219 Gastro-esophageal reflux disease without esophagitis: Secondary | ICD-10-CM | POA: Diagnosis not present

## 2018-12-24 DIAGNOSIS — E042 Nontoxic multinodular goiter: Secondary | ICD-10-CM | POA: Diagnosis not present

## 2018-12-24 DIAGNOSIS — D224 Melanocytic nevi of scalp and neck: Secondary | ICD-10-CM | POA: Diagnosis not present

## 2018-12-24 DIAGNOSIS — D2271 Melanocytic nevi of right lower limb, including hip: Secondary | ICD-10-CM | POA: Diagnosis not present

## 2018-12-24 DIAGNOSIS — Z0000 Encounter for general adult medical examination without abnormal findings: Secondary | ICD-10-CM | POA: Diagnosis not present

## 2018-12-24 DIAGNOSIS — D225 Melanocytic nevi of trunk: Secondary | ICD-10-CM | POA: Diagnosis not present

## 2018-12-24 DIAGNOSIS — Z85820 Personal history of malignant melanoma of skin: Secondary | ICD-10-CM | POA: Diagnosis not present

## 2018-12-24 DIAGNOSIS — D2262 Melanocytic nevi of left upper limb, including shoulder: Secondary | ICD-10-CM | POA: Diagnosis not present

## 2018-12-24 DIAGNOSIS — D2272 Melanocytic nevi of left lower limb, including hip: Secondary | ICD-10-CM | POA: Diagnosis not present

## 2018-12-24 DIAGNOSIS — R030 Elevated blood-pressure reading, without diagnosis of hypertension: Secondary | ICD-10-CM | POA: Diagnosis not present

## 2018-12-24 DIAGNOSIS — Z86018 Personal history of other benign neoplasm: Secondary | ICD-10-CM | POA: Diagnosis not present

## 2019-02-09 DIAGNOSIS — Z1231 Encounter for screening mammogram for malignant neoplasm of breast: Secondary | ICD-10-CM | POA: Diagnosis not present

## 2019-02-09 DIAGNOSIS — R309 Painful micturition, unspecified: Secondary | ICD-10-CM | POA: Diagnosis not present

## 2019-02-09 DIAGNOSIS — Z124 Encounter for screening for malignant neoplasm of cervix: Secondary | ICD-10-CM | POA: Diagnosis not present

## 2019-02-09 DIAGNOSIS — N760 Acute vaginitis: Secondary | ICD-10-CM | POA: Diagnosis not present

## 2019-03-04 DIAGNOSIS — H524 Presbyopia: Secondary | ICD-10-CM | POA: Diagnosis not present

## 2019-03-04 DIAGNOSIS — H5203 Hypermetropia, bilateral: Secondary | ICD-10-CM | POA: Diagnosis not present

## 2019-03-04 DIAGNOSIS — H52223 Regular astigmatism, bilateral: Secondary | ICD-10-CM | POA: Diagnosis not present

## 2019-04-01 DIAGNOSIS — Z23 Encounter for immunization: Secondary | ICD-10-CM | POA: Diagnosis not present

## 2019-04-13 DIAGNOSIS — E782 Mixed hyperlipidemia: Secondary | ICD-10-CM | POA: Diagnosis not present

## 2019-04-19 DIAGNOSIS — Z1211 Encounter for screening for malignant neoplasm of colon: Secondary | ICD-10-CM | POA: Diagnosis not present

## 2019-07-01 ENCOUNTER — Ambulatory Visit: Payer: Federal, State, Local not specified - PPO | Attending: Internal Medicine

## 2019-07-01 DIAGNOSIS — Z23 Encounter for immunization: Secondary | ICD-10-CM | POA: Insufficient documentation

## 2019-07-01 NOTE — Progress Notes (Signed)
   Covid-19 Vaccination Clinic  Name:  Diane Obrien    MRN: [id number] DOB: [birth year]  07/01/2019  Diane Obrien was observed post Covid-19 immunization for 15 minutes without incidence. She was provided with Vaccine Information Sheet and instruction to access the V-Safe system.   Diane Obrien was instructed to call 911 with any severe reactions post vaccine: Marland Kitchen Difficulty breathing  . Swelling of your face and throat  . A fast heartbeat  . A bad rash all over your body  . Dizziness and weakness    Immunizations Administered    Name Date Dose VIS Date Route   Pfizer COVID-19 Vaccine 07/01/2019  2:46 PM 0.3 mL 05/21/2019 Intramuscular   Manufacturer: [place]   Lot: [id number]   [place]: [id number]

## 2019-07-22 ENCOUNTER — Ambulatory Visit: Payer: Federal, State, Local not specified - PPO | Attending: Internal Medicine

## 2019-07-22 DIAGNOSIS — Z23 Encounter for immunization: Secondary | ICD-10-CM | POA: Insufficient documentation

## 2019-07-22 NOTE — Progress Notes (Signed)
   Covid-19 Vaccination Clinic  Name:  Diane Obrien    MRN: [id number] DOB: 06-16-1950  07/22/2019  [name] was observed post Covid-19 immunization for 15 minutes without incidence. She was provided with Vaccine Information Sheet and instruction to access the V-Safe system.   [name] was instructed to call 911 with any severe reactions post vaccine: Marland Kitchen Difficulty breathing  . Swelling of your face and throat  . A fast heartbeat  . A bad rash all over your body  . Dizziness and weakness    Immunizations Administered    Name Date Dose VIS Date Route   Pfizer COVID-19 Vaccine 07/22/2019  3:27 PM 0.3 mL 05/21/2019 Intramuscular   Manufacturer: [place]   Lot: [id number]   [place]: [id number]

## 2019-08-25 DIAGNOSIS — N644 Mastodynia: Secondary | ICD-10-CM | POA: Diagnosis not present

## 2019-08-25 DIAGNOSIS — T8549XA Other mechanical complication of breast prosthesis and implant, initial encounter: Secondary | ICD-10-CM | POA: Diagnosis not present

## 2019-08-25 DIAGNOSIS — T8544XA Capsular contracture of breast implant, initial encounter: Secondary | ICD-10-CM | POA: Diagnosis not present

## 2019-09-13 DIAGNOSIS — T8544XD Capsular contracture of breast implant, subsequent encounter: Secondary | ICD-10-CM | POA: Diagnosis not present

## 2019-09-13 DIAGNOSIS — T8549XA Other mechanical complication of breast prosthesis and implant, initial encounter: Secondary | ICD-10-CM | POA: Diagnosis not present

## 2019-09-13 DIAGNOSIS — N644 Mastodynia: Secondary | ICD-10-CM | POA: Diagnosis not present

## 2019-09-17 DIAGNOSIS — Z01818 Encounter for other preprocedural examination: Secondary | ICD-10-CM | POA: Diagnosis not present

## 2019-09-18 DIAGNOSIS — N390 Urinary tract infection, site not specified: Secondary | ICD-10-CM | POA: Diagnosis not present

## 2019-10-01 DIAGNOSIS — Z01818 Encounter for other preprocedural examination: Secondary | ICD-10-CM | POA: Diagnosis not present

## 2019-10-01 DIAGNOSIS — R300 Dysuria: Secondary | ICD-10-CM | POA: Diagnosis not present

## 2019-10-07 ENCOUNTER — Other Ambulatory Visit: Payer: Self-pay | Admitting: Plastic Surgery

## 2019-10-07 DIAGNOSIS — T85898A Other specified complication of other internal prosthetic devices, implants and grafts, initial encounter: Secondary | ICD-10-CM | POA: Diagnosis not present

## 2019-10-07 DIAGNOSIS — T8541XA Breakdown (mechanical) of breast prosthesis and implant, initial encounter: Secondary | ICD-10-CM | POA: Diagnosis not present

## 2019-10-07 DIAGNOSIS — T8549XA Other mechanical complication of breast prosthesis and implant, initial encounter: Secondary | ICD-10-CM | POA: Diagnosis not present

## 2019-10-07 DIAGNOSIS — T8544XA Capsular contracture of breast implant, initial encounter: Secondary | ICD-10-CM | POA: Diagnosis not present

## 2019-10-07 DIAGNOSIS — N644 Mastodynia: Secondary | ICD-10-CM | POA: Diagnosis not present

## 2019-12-28 DIAGNOSIS — E782 Mixed hyperlipidemia: Secondary | ICD-10-CM | POA: Diagnosis not present

## 2019-12-28 DIAGNOSIS — Z1211 Encounter for screening for malignant neoplasm of colon: Secondary | ICD-10-CM | POA: Diagnosis not present

## 2019-12-28 DIAGNOSIS — E042 Nontoxic multinodular goiter: Secondary | ICD-10-CM | POA: Diagnosis not present

## 2019-12-28 DIAGNOSIS — Z85820 Personal history of malignant melanoma of skin: Secondary | ICD-10-CM | POA: Diagnosis not present

## 2019-12-28 DIAGNOSIS — Z87898 Personal history of other specified conditions: Secondary | ICD-10-CM | POA: Diagnosis not present

## 2019-12-28 DIAGNOSIS — K219 Gastro-esophageal reflux disease without esophagitis: Secondary | ICD-10-CM | POA: Diagnosis not present

## 2019-12-28 DIAGNOSIS — Z0000 Encounter for general adult medical examination without abnormal findings: Secondary | ICD-10-CM | POA: Diagnosis not present

## 2019-12-28 DIAGNOSIS — Z1389 Encounter for screening for other disorder: Secondary | ICD-10-CM | POA: Diagnosis not present

## 2020-01-18 DIAGNOSIS — Z23 Encounter for immunization: Secondary | ICD-10-CM | POA: Diagnosis not present

## 2020-03-08 DIAGNOSIS — H00015 Hordeolum externum left lower eyelid: Secondary | ICD-10-CM | POA: Diagnosis not present

## 2020-03-22 DIAGNOSIS — B078 Other viral warts: Secondary | ICD-10-CM | POA: Diagnosis not present

## 2020-03-22 DIAGNOSIS — Z86018 Personal history of other benign neoplasm: Secondary | ICD-10-CM | POA: Diagnosis not present

## 2020-03-22 DIAGNOSIS — D2262 Melanocytic nevi of left upper limb, including shoulder: Secondary | ICD-10-CM | POA: Diagnosis not present

## 2020-03-22 DIAGNOSIS — D225 Melanocytic nevi of trunk: Secondary | ICD-10-CM | POA: Diagnosis not present

## 2020-03-22 DIAGNOSIS — Z85828 Personal history of other malignant neoplasm of skin: Secondary | ICD-10-CM | POA: Diagnosis not present

## 2020-03-22 DIAGNOSIS — D2271 Melanocytic nevi of right lower limb, including hip: Secondary | ICD-10-CM | POA: Diagnosis not present

## 2020-03-22 DIAGNOSIS — D2272 Melanocytic nevi of left lower limb, including hip: Secondary | ICD-10-CM | POA: Diagnosis not present

## 2020-03-22 DIAGNOSIS — D224 Melanocytic nevi of scalp and neck: Secondary | ICD-10-CM | POA: Diagnosis not present

## 2020-03-22 DIAGNOSIS — D485 Neoplasm of uncertain behavior of skin: Secondary | ICD-10-CM | POA: Diagnosis not present

## 2020-03-22 DIAGNOSIS — Z87898 Personal history of other specified conditions: Secondary | ICD-10-CM | POA: Diagnosis not present

## 2020-03-22 DIAGNOSIS — L578 Other skin changes due to chronic exposure to nonionizing radiation: Secondary | ICD-10-CM | POA: Diagnosis not present

## 2020-03-22 DIAGNOSIS — L821 Other seborrheic keratosis: Secondary | ICD-10-CM | POA: Diagnosis not present

## 2020-03-22 DIAGNOSIS — Z808 Family history of malignant neoplasm of other organs or systems: Secondary | ICD-10-CM | POA: Diagnosis not present

## 2020-03-29 DIAGNOSIS — Z23 Encounter for immunization: Secondary | ICD-10-CM | POA: Diagnosis not present

## 2020-04-11 DIAGNOSIS — Z23 Encounter for immunization: Secondary | ICD-10-CM | POA: Diagnosis not present

## 2020-04-19 DIAGNOSIS — M9902 Segmental and somatic dysfunction of thoracic region: Secondary | ICD-10-CM | POA: Diagnosis not present

## 2020-04-19 DIAGNOSIS — M50122 Cervical disc disorder at C5-C6 level with radiculopathy: Secondary | ICD-10-CM | POA: Diagnosis not present

## 2020-04-19 DIAGNOSIS — M9901 Segmental and somatic dysfunction of cervical region: Secondary | ICD-10-CM | POA: Diagnosis not present

## 2020-04-19 DIAGNOSIS — M5384 Other specified dorsopathies, thoracic region: Secondary | ICD-10-CM | POA: Diagnosis not present

## 2020-04-24 DIAGNOSIS — M9901 Segmental and somatic dysfunction of cervical region: Secondary | ICD-10-CM | POA: Diagnosis not present

## 2020-04-24 DIAGNOSIS — M50122 Cervical disc disorder at C5-C6 level with radiculopathy: Secondary | ICD-10-CM | POA: Diagnosis not present

## 2020-04-24 DIAGNOSIS — M5384 Other specified dorsopathies, thoracic region: Secondary | ICD-10-CM | POA: Diagnosis not present

## 2020-04-24 DIAGNOSIS — M9902 Segmental and somatic dysfunction of thoracic region: Secondary | ICD-10-CM | POA: Diagnosis not present

## 2020-05-01 DIAGNOSIS — M50122 Cervical disc disorder at C5-C6 level with radiculopathy: Secondary | ICD-10-CM | POA: Diagnosis not present

## 2020-05-01 DIAGNOSIS — M9902 Segmental and somatic dysfunction of thoracic region: Secondary | ICD-10-CM | POA: Diagnosis not present

## 2020-05-01 DIAGNOSIS — M5384 Other specified dorsopathies, thoracic region: Secondary | ICD-10-CM | POA: Diagnosis not present

## 2020-05-01 DIAGNOSIS — M9901 Segmental and somatic dysfunction of cervical region: Secondary | ICD-10-CM | POA: Diagnosis not present

## 2020-05-09 DIAGNOSIS — M50122 Cervical disc disorder at C5-C6 level with radiculopathy: Secondary | ICD-10-CM | POA: Diagnosis not present

## 2020-05-09 DIAGNOSIS — M9901 Segmental and somatic dysfunction of cervical region: Secondary | ICD-10-CM | POA: Diagnosis not present

## 2020-05-09 DIAGNOSIS — M9902 Segmental and somatic dysfunction of thoracic region: Secondary | ICD-10-CM | POA: Diagnosis not present

## 2020-05-09 DIAGNOSIS — M5384 Other specified dorsopathies, thoracic region: Secondary | ICD-10-CM | POA: Diagnosis not present

## 2020-05-23 DIAGNOSIS — M9902 Segmental and somatic dysfunction of thoracic region: Secondary | ICD-10-CM | POA: Diagnosis not present

## 2020-05-23 DIAGNOSIS — M50122 Cervical disc disorder at C5-C6 level with radiculopathy: Secondary | ICD-10-CM | POA: Diagnosis not present

## 2020-05-23 DIAGNOSIS — M5384 Other specified dorsopathies, thoracic region: Secondary | ICD-10-CM | POA: Diagnosis not present

## 2020-05-23 DIAGNOSIS — M9901 Segmental and somatic dysfunction of cervical region: Secondary | ICD-10-CM | POA: Diagnosis not present

## 2020-05-26 ENCOUNTER — Ambulatory Visit: Payer: Medicare Other

## 2020-05-27 ENCOUNTER — Ambulatory Visit: Payer: Federal, State, Local not specified - PPO | Attending: Internal Medicine

## 2020-05-27 DIAGNOSIS — Z23 Encounter for immunization: Secondary | ICD-10-CM

## 2020-05-27 NOTE — Progress Notes (Signed)
   Covid-19 Vaccination Clinic  Name:  Diane Obrien    MRN: [id number] DOB: April 20, 1951  05/27/2020  Diane Obrien was observed post Covid-19 immunization for 15 minutes without incident. She was provided with Vaccine Information Sheet and instruction to access the V-Safe system.   Diane Obrien was instructed to call 911 with any severe reactions post vaccine: Marland Kitchen Difficulty breathing  . Swelling of face and throat  . A fast heartbeat  . A bad rash all over body  . Dizziness and weakness   Immunizations Administered    Name Date Dose VIS Date Route   Pfizer COVID-19 Vaccine 05/27/2020  1:04 PM 0.3 mL 03/29/2020 Intramuscular   Manufacturer: [place]   Lot: [id number]   [place]: [id number]

## 2020-05-30 DIAGNOSIS — Z01419 Encounter for gynecological examination (general) (routine) without abnormal findings: Secondary | ICD-10-CM | POA: Diagnosis not present

## 2020-05-30 DIAGNOSIS — N390 Urinary tract infection, site not specified: Secondary | ICD-10-CM | POA: Diagnosis not present

## 2020-05-30 DIAGNOSIS — R3915 Urgency of urination: Secondary | ICD-10-CM | POA: Diagnosis not present

## 2020-05-30 DIAGNOSIS — N760 Acute vaginitis: Secondary | ICD-10-CM | POA: Diagnosis not present

## 2020-08-22 DIAGNOSIS — Z1231 Encounter for screening mammogram for malignant neoplasm of breast: Secondary | ICD-10-CM | POA: Diagnosis not present

## 2020-10-21 DIAGNOSIS — J069 Acute upper respiratory infection, unspecified: Secondary | ICD-10-CM | POA: Diagnosis not present

## 2020-11-08 DIAGNOSIS — H43813 Vitreous degeneration, bilateral: Secondary | ICD-10-CM | POA: Diagnosis not present

## 2020-11-08 DIAGNOSIS — H2513 Age-related nuclear cataract, bilateral: Secondary | ICD-10-CM | POA: Diagnosis not present

## 2020-11-08 DIAGNOSIS — H353131 Nonexudative age-related macular degeneration, bilateral, early dry stage: Secondary | ICD-10-CM | POA: Diagnosis not present

## 2021-04-04 DIAGNOSIS — D225 Melanocytic nevi of trunk: Secondary | ICD-10-CM | POA: Diagnosis not present

## 2021-04-04 DIAGNOSIS — D2272 Melanocytic nevi of left lower limb, including hip: Secondary | ICD-10-CM | POA: Diagnosis not present

## 2021-04-04 DIAGNOSIS — Z85828 Personal history of other malignant neoplasm of skin: Secondary | ICD-10-CM | POA: Diagnosis not present

## 2021-04-04 DIAGNOSIS — L821 Other seborrheic keratosis: Secondary | ICD-10-CM | POA: Diagnosis not present

## 2021-04-04 DIAGNOSIS — D224 Melanocytic nevi of scalp and neck: Secondary | ICD-10-CM | POA: Diagnosis not present

## 2021-04-04 DIAGNOSIS — L578 Other skin changes due to chronic exposure to nonionizing radiation: Secondary | ICD-10-CM | POA: Diagnosis not present

## 2021-04-04 DIAGNOSIS — Z808 Family history of malignant neoplasm of other organs or systems: Secondary | ICD-10-CM | POA: Diagnosis not present

## 2021-04-04 DIAGNOSIS — D2262 Melanocytic nevi of left upper limb, including shoulder: Secondary | ICD-10-CM | POA: Diagnosis not present

## 2021-04-04 DIAGNOSIS — Z87898 Personal history of other specified conditions: Secondary | ICD-10-CM | POA: Diagnosis not present

## 2021-04-04 DIAGNOSIS — D485 Neoplasm of uncertain behavior of skin: Secondary | ICD-10-CM | POA: Diagnosis not present

## 2021-04-04 DIAGNOSIS — D2271 Melanocytic nevi of right lower limb, including hip: Secondary | ICD-10-CM | POA: Diagnosis not present

## 2021-04-04 DIAGNOSIS — Z86018 Personal history of other benign neoplasm: Secondary | ICD-10-CM | POA: Diagnosis not present

## 2021-04-11 DIAGNOSIS — Z23 Encounter for immunization: Secondary | ICD-10-CM | POA: Diagnosis not present

## 2021-05-16 DIAGNOSIS — D485 Neoplasm of uncertain behavior of skin: Secondary | ICD-10-CM | POA: Diagnosis not present

## 2021-05-16 DIAGNOSIS — L988 Other specified disorders of the skin and subcutaneous tissue: Secondary | ICD-10-CM | POA: Diagnosis not present

## 2021-07-23 DIAGNOSIS — M9901 Segmental and somatic dysfunction of cervical region: Secondary | ICD-10-CM | POA: Diagnosis not present

## 2021-07-23 DIAGNOSIS — M5137 Other intervertebral disc degeneration, lumbosacral region: Secondary | ICD-10-CM | POA: Diagnosis not present

## 2021-07-23 DIAGNOSIS — M50122 Cervical disc disorder at C5-C6 level with radiculopathy: Secondary | ICD-10-CM | POA: Diagnosis not present

## 2021-07-23 DIAGNOSIS — M9903 Segmental and somatic dysfunction of lumbar region: Secondary | ICD-10-CM | POA: Diagnosis not present

## 2021-07-26 DIAGNOSIS — M9903 Segmental and somatic dysfunction of lumbar region: Secondary | ICD-10-CM | POA: Diagnosis not present

## 2021-07-26 DIAGNOSIS — M50122 Cervical disc disorder at C5-C6 level with radiculopathy: Secondary | ICD-10-CM | POA: Diagnosis not present

## 2021-07-26 DIAGNOSIS — M9901 Segmental and somatic dysfunction of cervical region: Secondary | ICD-10-CM | POA: Diagnosis not present

## 2021-07-26 DIAGNOSIS — M5137 Other intervertebral disc degeneration, lumbosacral region: Secondary | ICD-10-CM | POA: Diagnosis not present

## 2021-07-30 DIAGNOSIS — M9901 Segmental and somatic dysfunction of cervical region: Secondary | ICD-10-CM | POA: Diagnosis not present

## 2021-07-30 DIAGNOSIS — M5137 Other intervertebral disc degeneration, lumbosacral region: Secondary | ICD-10-CM | POA: Diagnosis not present

## 2021-07-30 DIAGNOSIS — M9903 Segmental and somatic dysfunction of lumbar region: Secondary | ICD-10-CM | POA: Diagnosis not present

## 2021-07-30 DIAGNOSIS — M50122 Cervical disc disorder at C5-C6 level with radiculopathy: Secondary | ICD-10-CM | POA: Diagnosis not present

## 2021-07-31 DIAGNOSIS — M50122 Cervical disc disorder at C5-C6 level with radiculopathy: Secondary | ICD-10-CM | POA: Diagnosis not present

## 2021-07-31 DIAGNOSIS — M9903 Segmental and somatic dysfunction of lumbar region: Secondary | ICD-10-CM | POA: Diagnosis not present

## 2021-07-31 DIAGNOSIS — M5137 Other intervertebral disc degeneration, lumbosacral region: Secondary | ICD-10-CM | POA: Diagnosis not present

## 2021-07-31 DIAGNOSIS — M9901 Segmental and somatic dysfunction of cervical region: Secondary | ICD-10-CM | POA: Diagnosis not present

## 2021-08-06 DIAGNOSIS — M9901 Segmental and somatic dysfunction of cervical region: Secondary | ICD-10-CM | POA: Diagnosis not present

## 2021-08-06 DIAGNOSIS — M5137 Other intervertebral disc degeneration, lumbosacral region: Secondary | ICD-10-CM | POA: Diagnosis not present

## 2021-08-06 DIAGNOSIS — M9903 Segmental and somatic dysfunction of lumbar region: Secondary | ICD-10-CM | POA: Diagnosis not present

## 2021-08-06 DIAGNOSIS — M50122 Cervical disc disorder at C5-C6 level with radiculopathy: Secondary | ICD-10-CM | POA: Diagnosis not present

## 2021-08-08 DIAGNOSIS — M5137 Other intervertebral disc degeneration, lumbosacral region: Secondary | ICD-10-CM | POA: Diagnosis not present

## 2021-08-08 DIAGNOSIS — M9901 Segmental and somatic dysfunction of cervical region: Secondary | ICD-10-CM | POA: Diagnosis not present

## 2021-08-08 DIAGNOSIS — M50122 Cervical disc disorder at C5-C6 level with radiculopathy: Secondary | ICD-10-CM | POA: Diagnosis not present

## 2021-08-08 DIAGNOSIS — M9903 Segmental and somatic dysfunction of lumbar region: Secondary | ICD-10-CM | POA: Diagnosis not present

## 2021-08-14 DIAGNOSIS — M50122 Cervical disc disorder at C5-C6 level with radiculopathy: Secondary | ICD-10-CM | POA: Diagnosis not present

## 2021-08-14 DIAGNOSIS — M5137 Other intervertebral disc degeneration, lumbosacral region: Secondary | ICD-10-CM | POA: Diagnosis not present

## 2021-08-14 DIAGNOSIS — M9903 Segmental and somatic dysfunction of lumbar region: Secondary | ICD-10-CM | POA: Diagnosis not present

## 2021-08-14 DIAGNOSIS — M9901 Segmental and somatic dysfunction of cervical region: Secondary | ICD-10-CM | POA: Diagnosis not present

## 2021-08-22 DIAGNOSIS — M5137 Other intervertebral disc degeneration, lumbosacral region: Secondary | ICD-10-CM | POA: Diagnosis not present

## 2021-08-22 DIAGNOSIS — M50122 Cervical disc disorder at C5-C6 level with radiculopathy: Secondary | ICD-10-CM | POA: Diagnosis not present

## 2021-08-22 DIAGNOSIS — M9903 Segmental and somatic dysfunction of lumbar region: Secondary | ICD-10-CM | POA: Diagnosis not present

## 2021-08-22 DIAGNOSIS — M9901 Segmental and somatic dysfunction of cervical region: Secondary | ICD-10-CM | POA: Diagnosis not present

## 2021-08-24 DIAGNOSIS — N760 Acute vaginitis: Secondary | ICD-10-CM | POA: Diagnosis not present

## 2021-08-24 DIAGNOSIS — R350 Frequency of micturition: Secondary | ICD-10-CM | POA: Diagnosis not present

## 2021-08-24 DIAGNOSIS — Z01419 Encounter for gynecological examination (general) (routine) without abnormal findings: Secondary | ICD-10-CM | POA: Diagnosis not present

## 2021-09-03 DIAGNOSIS — M5137 Other intervertebral disc degeneration, lumbosacral region: Secondary | ICD-10-CM | POA: Diagnosis not present

## 2021-09-03 DIAGNOSIS — M50122 Cervical disc disorder at C5-C6 level with radiculopathy: Secondary | ICD-10-CM | POA: Diagnosis not present

## 2021-09-03 DIAGNOSIS — M9901 Segmental and somatic dysfunction of cervical region: Secondary | ICD-10-CM | POA: Diagnosis not present

## 2021-09-03 DIAGNOSIS — M9903 Segmental and somatic dysfunction of lumbar region: Secondary | ICD-10-CM | POA: Diagnosis not present

## 2021-09-07 DIAGNOSIS — H20011 Primary iridocyclitis, right eye: Secondary | ICD-10-CM | POA: Diagnosis not present

## 2021-09-07 DIAGNOSIS — H02422 Myogenic ptosis of left eyelid: Secondary | ICD-10-CM | POA: Diagnosis not present

## 2021-09-07 DIAGNOSIS — H353131 Nonexudative age-related macular degeneration, bilateral, early dry stage: Secondary | ICD-10-CM | POA: Diagnosis not present

## 2021-09-07 DIAGNOSIS — H2513 Age-related nuclear cataract, bilateral: Secondary | ICD-10-CM | POA: Diagnosis not present

## 2021-09-10 DIAGNOSIS — Z1231 Encounter for screening mammogram for malignant neoplasm of breast: Secondary | ICD-10-CM | POA: Diagnosis not present

## 2021-09-11 DIAGNOSIS — H20011 Primary iridocyclitis, right eye: Secondary | ICD-10-CM | POA: Diagnosis not present

## 2021-09-12 ENCOUNTER — Other Ambulatory Visit: Payer: Self-pay | Admitting: Obstetrics and Gynecology

## 2021-09-12 DIAGNOSIS — R928 Other abnormal and inconclusive findings on diagnostic imaging of breast: Secondary | ICD-10-CM

## 2021-10-02 DIAGNOSIS — H02422 Myogenic ptosis of left eyelid: Secondary | ICD-10-CM | POA: Diagnosis not present

## 2021-10-02 DIAGNOSIS — H353131 Nonexudative age-related macular degeneration, bilateral, early dry stage: Secondary | ICD-10-CM | POA: Diagnosis not present

## 2021-10-02 DIAGNOSIS — H2513 Age-related nuclear cataract, bilateral: Secondary | ICD-10-CM | POA: Diagnosis not present

## 2021-10-02 DIAGNOSIS — H20011 Primary iridocyclitis, right eye: Secondary | ICD-10-CM | POA: Diagnosis not present

## 2021-10-02 DIAGNOSIS — H1013 Acute atopic conjunctivitis, bilateral: Secondary | ICD-10-CM | POA: Diagnosis not present

## 2021-10-03 ENCOUNTER — Ambulatory Visit
Admission: RE | Admit: 2021-10-03 | Discharge: 2021-10-03 | Disposition: A | Discharge status: Home / Self Care | Payer: Medicare Other | Source: Ambulatory Visit | Attending: Obstetrics and Gynecology | Admitting: Obstetrics and Gynecology

## 2021-10-03 ENCOUNTER — Other Ambulatory Visit: Payer: Self-pay | Admitting: Obstetrics and Gynecology

## 2021-10-03 DIAGNOSIS — R921 Mammographic calcification found on diagnostic imaging of breast: Secondary | ICD-10-CM | POA: Diagnosis not present

## 2021-10-03 DIAGNOSIS — R928 Other abnormal and inconclusive findings on diagnostic imaging of breast: Secondary | ICD-10-CM

## 2021-10-11 ENCOUNTER — Ambulatory Visit
Admission: RE | Admit: 2021-10-11 | Discharge: 2021-10-11 | Disposition: A | Discharge status: Home / Self Care | Payer: Federal, State, Local not specified - PPO | Source: Ambulatory Visit | Attending: Obstetrics and Gynecology | Admitting: Obstetrics and Gynecology

## 2021-10-11 DIAGNOSIS — D0511 Intraductal carcinoma in situ of right breast: Secondary | ICD-10-CM | POA: Diagnosis not present

## 2021-10-11 DIAGNOSIS — R921 Mammographic calcification found on diagnostic imaging of breast: Secondary | ICD-10-CM | POA: Diagnosis not present

## 2021-10-11 DIAGNOSIS — C50919 Malignant neoplasm of unspecified site of unspecified female breast: Secondary | ICD-10-CM

## 2021-10-11 HISTORY — DX: Malignant neoplasm of unspecified site of unspecified female breast: C50.919

## 2021-10-29 DIAGNOSIS — D0511 Intraductal carcinoma in situ of right breast: Secondary | ICD-10-CM | POA: Diagnosis not present

## 2021-10-30 DIAGNOSIS — J019 Acute sinusitis, unspecified: Secondary | ICD-10-CM | POA: Diagnosis not present

## 2021-11-08 ENCOUNTER — Ambulatory Visit: Payer: Self-pay | Admitting: General Surgery

## 2021-11-08 ENCOUNTER — Telehealth: Payer: Self-pay | Admitting: Hematology and Oncology

## 2021-11-08 DIAGNOSIS — Z85820 Personal history of malignant melanoma of skin: Secondary | ICD-10-CM | POA: Diagnosis not present

## 2021-11-08 DIAGNOSIS — D0511 Intraductal carcinoma in situ of right breast: Secondary | ICD-10-CM

## 2021-11-08 DIAGNOSIS — Z0000 Encounter for general adult medical examination without abnormal findings: Secondary | ICD-10-CM | POA: Diagnosis not present

## 2021-11-08 DIAGNOSIS — Z1211 Encounter for screening for malignant neoplasm of colon: Secondary | ICD-10-CM | POA: Diagnosis not present

## 2021-11-08 DIAGNOSIS — K219 Gastro-esophageal reflux disease without esophagitis: Secondary | ICD-10-CM | POA: Diagnosis not present

## 2021-11-08 DIAGNOSIS — C50911 Malignant neoplasm of unspecified site of right female breast: Secondary | ICD-10-CM | POA: Diagnosis not present

## 2021-11-08 DIAGNOSIS — Z1331 Encounter for screening for depression: Secondary | ICD-10-CM | POA: Diagnosis not present

## 2021-11-08 DIAGNOSIS — Z87898 Personal history of other specified conditions: Secondary | ICD-10-CM | POA: Diagnosis not present

## 2021-11-08 DIAGNOSIS — E782 Mixed hyperlipidemia: Secondary | ICD-10-CM | POA: Diagnosis not present

## 2021-11-08 DIAGNOSIS — E042 Nontoxic multinodular goiter: Secondary | ICD-10-CM | POA: Diagnosis not present

## 2021-11-08 NOTE — Telephone Encounter (Signed)
Scheduled appt per 6/1 referral. Pt is aware of appt date and time. Pt is aware to arrive 15 mins prior to appt time and to bring and updated insurance card. Pt is aware of appt location.

## 2021-11-09 ENCOUNTER — Encounter: Payer: Self-pay | Admitting: *Deleted

## 2021-11-12 ENCOUNTER — Other Ambulatory Visit: Payer: Self-pay | Admitting: General Surgery

## 2021-11-12 DIAGNOSIS — D0511 Intraductal carcinoma in situ of right breast: Secondary | ICD-10-CM

## 2021-11-13 DIAGNOSIS — H10021 Other mucopurulent conjunctivitis, right eye: Secondary | ICD-10-CM | POA: Diagnosis not present

## 2021-11-13 NOTE — Progress Notes (Signed)
New Breast Cancer Diagnosis: Right Breast UOQ  Did patient present with symptoms (if so, please note symptoms) or screening mammography?: Screening mammogram noted a new cluster of calcifications in the upper outer quadrant of the right breast.   Location and Extent of disease :right breast. Located in the upper outer quadrant, measured 1.6 cm in greatest dimension. Adenopathy no.  Histology per Pathology Report: grade 1, DCIS 10/11/2021  Receptor Status: ER(positive), PR (positive), Her2-neu (), Ki-(%)  Surgeon and surgical plan, if any:  Dr. Marlou Starks -Right Breast Lumpectomy with radioactive seed localization 12/07/2021   Medical oncologist, treatment if any:   Dr. Chryl Heck 11/29/2021   Family History of Breast/Ovarian/Prostate Cancer: Mother had breast cancer.  Maternal Grandmother had ovarian cancer.  Lymphedema issues, if any: No     Pain issues, if any: Occasional tenderness at biopsy site.    SAFETY ISSUES: Prior radiation? No Pacemaker/ICD? No Possible current pregnancy? Postmenopausal Is the patient on methotrexate? No  Current Complaints / other details:

## 2021-11-15 ENCOUNTER — Other Ambulatory Visit: Payer: Self-pay

## 2021-11-15 ENCOUNTER — Ambulatory Visit
Admission: RE | Admit: 2021-11-15 | Discharge: 2021-11-15 | Disposition: A | Discharge status: Home / Self Care | Payer: Federal, State, Local not specified - PPO | Source: Ambulatory Visit | Attending: Radiation Oncology | Admitting: Radiation Oncology

## 2021-11-15 ENCOUNTER — Encounter: Payer: Self-pay | Admitting: Radiation Oncology

## 2021-11-15 VITALS — BP 163/89 | HR 92 | Temp 97.2°F | Resp 18 | Ht 64.0 in | Wt 144.5 lb

## 2021-11-15 DIAGNOSIS — Z8041 Family history of malignant neoplasm of ovary: Secondary | ICD-10-CM | POA: Diagnosis not present

## 2021-11-15 DIAGNOSIS — Z85828 Personal history of other malignant neoplasm of skin: Secondary | ICD-10-CM | POA: Insufficient documentation

## 2021-11-15 DIAGNOSIS — Z803 Family history of malignant neoplasm of breast: Secondary | ICD-10-CM | POA: Diagnosis not present

## 2021-11-15 DIAGNOSIS — Z87891 Personal history of nicotine dependence: Secondary | ICD-10-CM | POA: Diagnosis not present

## 2021-11-15 DIAGNOSIS — Z170 Estrogen receptor positive status [ER+]: Secondary | ICD-10-CM | POA: Insufficient documentation

## 2021-11-15 DIAGNOSIS — D0511 Intraductal carcinoma in situ of right breast: Secondary | ICD-10-CM

## 2021-11-15 DIAGNOSIS — K219 Gastro-esophageal reflux disease without esophagitis: Secondary | ICD-10-CM | POA: Diagnosis not present

## 2021-11-15 NOTE — Progress Notes (Signed)
Radiation Oncology         [phone] ________________________________  Name: Diane Obrien        MRN: [id number]  Date of Service: 11/15/2021 DOB: 01-28-1951  LF:YBOFBP, Shanon Brow, MD  Jovita Kussmaul, MD     REFERRING PHYSICIAN: Jovita Kussmaul, MD   DIAGNOSIS: The encounter diagnosis was Ductal carcinoma in situ (DCIS) of right breast.   HISTORY OF PRESENT ILLNESS: Diane Obrien is a 71 y.o. female seen at the request of Dr. Marlou Starks for a new diagnosis of right breast cancer. The patient was noted to have screening calcificiations on mammogram and these measured up to 1.6 cm in the upper outer quadrant of the right breast. She underwent stereotactic biopsy on 10/11/21 that showed low grade DCIS that was ER/PR positive. She's planning to meet Dr. Chryl Heck on 11/29/21 and to have right lumpectomy on 12/07/21. She's seen to discuss adjuvant therapy.     PREVIOUS RADIATION THERAPY: No   PAST MEDICAL HISTORY:  Past Medical History:  Diagnosis Date   Arthritis    knees   Breast cancer ([place]) 10/11/2021   GERD (gastroesophageal reflux disease)    Melanoma ([place]) 06/10/2010   nose       PAST SURGICAL HISTORY: Past Surgical History:  Procedure Laterality Date   ANAL FISSURE REPAIR      x2   AUGMENTATION MAMMAPLASTY Bilateral    removed 2021   CESAREAN SECTION  1988, 1990   HEMORRHOIDECTOMY WITH HEMORRHOID BANDING       FAMILY HISTORY:  Family History  Problem Relation Age of Onset   Breast cancer Mother        age 83 or 80   Ovarian cancer Maternal Grandmother    Colon cancer Neg Hx    Stomach cancer Neg Hx      SOCIAL HISTORY:  reports that she quit smoking about 38 years ago. She does not have any smokeless tobacco history on file. She reports that she does not drink alcohol and does not use drugs. The patient is married and lives in [place]. 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    ALLERGIES: Other and Pravastatin sodium   MEDICATIONS:  Current Outpatient Medications  Medication Sig Dispense Refill   azelastine (OPTIVAR) 0.05 % ophthalmic solution Apply to eye.     Multiple Vitamins-Minerals (MULTIVITAMIN WITH MINERALS) tablet Take 1 tablet by mouth daily.     prednisoLONE acetate (PRED FORTE) 1 % ophthalmic suspension SMARTSIG:In Eye(s)     meclizine (ANTIVERT) 25 MG tablet Take by mouth. (Patient not taking: Reported on 11/15/2021)     NEXIUM 40 MG capsule Take 40 mg by mouth daily before breakfast.  (Patient not taking: Reported on 11/15/2021)     nitrofurantoin (MACRODANTIN) 50 MG capsule Take 50 mg by mouth as needed.  (Patient not taking: Reported on 11/15/2021)     No current facility-administered medications for this encounter.     REVIEW OF SYSTEMS: On review of systems, the patient reports that she is doing well without any bruising, but she has had some tenderness at her biopsy site. No other complaints are noted.      PHYSICAL EXAM:  Wt Readings from Last 3 Encounters:  11/15/21 144 lb 8 oz (65.5 kg)  05/28/12 159 lb (72.1 kg)  05/14/12 159 lb 3.2 oz (72.2 kg)   Temp Readings from Last 3 Encounters:  11/15/21 (!) 97.2 F (36.2 C) (Temporal)  05/28/12 97.3 F (36.3 C)   BP Readings from Last 3 Encounters:  11/15/21 (!) 163/89  05/28/12 (!) 143/78   Pulse Readings from Last 3 Encounters:  11/15/21 92  05/28/12 73    In general this is a well appearing caucasian female who appears younger than her stated age in no acute distress. She's alert and oriented x4 and appropriate throughout the examination. Cardiopulmonary assessment is negative for acute distress and she exhibits normal effort. Bilateral breast exam is deferred.    ECOG = 1  0 - Asymptomatic (Fully active, able to carry on all predisease activities without restriction)  1 - Symptomatic but completely ambulatory (Restricted in physically strenuous activity but  ambulatory and able to carry out work of a light or sedentary nature. For example, light housework, office work)  2 - Symptomatic, <50% in bed during the day (Ambulatory and capable of all self care but unable to carry out any work activities. Up and about more than 50% of waking hours)  3 - Symptomatic, >50% in bed, but not bedbound (Capable of only limited self-care, confined to bed or chair 50% or more of waking hours)  4 - Bedbound (Completely disabled. Cannot carry on any self-care. Totally confined to bed or chair)  5 - Death   Eustace Pen MM, Creech RH, Tormey DC, et al. [phone]). "Toxicity and response criteria of the [facility]". [place] Oncol. 5 (6): 649-55    LABORATORY DATA:  Lab Results  Component Value Date   WBC 5.2 08/05/2008   HGB 13.5 08/05/2008   HCT 39.5 08/05/2008   MCV 90.6 08/05/2008   PLT 217 08/05/2008   Lab Results  Component Value Date   NA 141 08/05/2008   K 3.8 08/05/2008   CL 103 08/05/2008   CO2 29 08/05/2008   Lab Results  Component Value Date   ALT 23 08/05/2008   AST 24 08/05/2008   ALKPHOS 86 08/05/2008   BILITOT 0.8 08/05/2008      RADIOGRAPHY: No results found.     IMPRESSION/PLAN: 1. Low Grade, ER/PR positive DCIS of the right breast. Dr. Lisbeth Renshaw discusses the pathology findings and reviews the nature of early stage right breast disease. She will proceed with lumpectomy, and we discussed the rationale for adjuvant external radiotherapy to the breast  to reduce risks of local recurrence followed by antiestrogen therapy. Dr. Lisbeth Renshaw also discusses cases in which radiation may be optional for favorable cases based on final pathology. We discussed the risks, benefits, short, and long term effects of radiotherapy, as well as the curative intent, and the patient is interested in revisiting her final decision after surgery. Dr. Lisbeth Renshaw discusses the delivery and logistics of radiotherapy and anticipates a course of 4 weeks of  radiotherapy to the right breast if she proceeds. We will see her back a few weeks after surgery to discuss how she'd like to proceed, and if moving forward, continue with the simulation process.  We would anticipate starting radiotherapy about 4-6 weeks after surgery.      In a visit lasting 60 minutes, greater than 50% of the time was spent face to face reviewing her case, as well as in preparation of, discussing, and coordinating the patient's care.  The above documentation reflects my direct findings during this shared patient visit. Please see the separate note by Dr. Lisbeth Renshaw on this date for the remainder of the patient's plan of care.  Carola Rhine, [facility]    **Disclaimer: This note was dictated with voice recognition software. Similar sounding words can inadvertently be transcribed and this note may contain transcription errors which may not have been corrected upon publication of note.**

## 2021-11-20 DIAGNOSIS — H10021 Other mucopurulent conjunctivitis, right eye: Secondary | ICD-10-CM | POA: Diagnosis not present

## 2021-11-20 DIAGNOSIS — H2513 Age-related nuclear cataract, bilateral: Secondary | ICD-10-CM | POA: Diagnosis not present

## 2021-11-29 ENCOUNTER — Other Ambulatory Visit: Payer: Self-pay

## 2021-11-29 ENCOUNTER — Encounter: Payer: Self-pay | Admitting: *Deleted

## 2021-11-29 ENCOUNTER — Inpatient Hospital Stay: Payer: Medicare Other

## 2021-11-29 ENCOUNTER — Encounter (HOSPITAL_BASED_OUTPATIENT_CLINIC_OR_DEPARTMENT_OTHER): Payer: Self-pay | Admitting: General Surgery

## 2021-11-29 ENCOUNTER — Encounter: Payer: Self-pay | Admitting: Hematology and Oncology

## 2021-11-29 ENCOUNTER — Inpatient Hospital Stay: Payer: Medicare Other | Attending: Hematology and Oncology | Admitting: Hematology and Oncology

## 2021-11-29 DIAGNOSIS — Z87891 Personal history of nicotine dependence: Secondary | ICD-10-CM | POA: Insufficient documentation

## 2021-11-29 DIAGNOSIS — D0511 Intraductal carcinoma in situ of right breast: Secondary | ICD-10-CM

## 2021-11-29 DIAGNOSIS — Z85820 Personal history of malignant melanoma of skin: Secondary | ICD-10-CM | POA: Diagnosis not present

## 2021-11-29 DIAGNOSIS — Z803 Family history of malignant neoplasm of breast: Secondary | ICD-10-CM | POA: Diagnosis not present

## 2021-11-29 DIAGNOSIS — Z800 Family history of malignant neoplasm of digestive organs: Secondary | ICD-10-CM | POA: Diagnosis not present

## 2021-11-29 DIAGNOSIS — D0510 Intraductal carcinoma in situ of unspecified breast: Secondary | ICD-10-CM | POA: Insufficient documentation

## 2021-11-29 NOTE — Assessment & Plan Note (Addendum)
This is a very pleasant 71 year old postmenopausal female patient newly diagnosed with right breast DCIS on screening mammogram, prognostics ER 100% positive strong staining, PR 15% positive strong to moderate staining referred to medical oncology for recommendations.  Pathology review: I discussed with the patient the difference between DCIS and invasive breast cancer. It is considered a precancerous lesion. DCIS is classified as a Stage 0 breast cancer. It is generally detected through mammograms as calcifications. We discussed the significance of grades and its impact on prognosis. We also discussed the importance of ER and PR receptors and their implications to adjuvant treatment options. Prognosis of DCIS dependence on grade and degree of comedo necrosis. It is anticipated that if not treated, 20-30% of DCIS can develop into invasive breast cancer.  Recommendation: 1. Breast conserving surgery 2. Followed by adjuvant radiation therapy 3. Followed by antiestrogen therapy with tamoxifen/aromatase inhibitors based on menopausal status 5 years  Tamoxifen counseling: We discussed the risks and benefits of tamoxifen. These include but not limited to insomnia, hot flashes, mood changes, vaginal dryness, and weight gain. Although rare, serious side effects including endometrial cancer, risk of blood clots were also discussed. We strongly believe that the benefits far outweigh the risks. Patient understands these risks and consented to starting treatment. Planned treatment duration is 5 years.  Aromatase inhibitors counseling: We have discussed the mechanism of action of aromatase inhibitors today.  We have discussed adverse effects including but not limited to menopausal symptoms, increased risk of osteoporosis and fractures, cardiovascular events, arthralgias and myalgias.  We do believe that the benefits far outweigh the risks.  Plan treatment duration of 5 years.  I have given her printed information  about tamoxifen and aromatase inhibitors for further review.  She will return to clinic in August to discuss additional recommendations

## 2021-12-04 DIAGNOSIS — E782 Mixed hyperlipidemia: Secondary | ICD-10-CM | POA: Diagnosis not present

## 2021-12-04 NOTE — Progress Notes (Signed)

## 2021-12-06 ENCOUNTER — Ambulatory Visit
Admission: RE | Admit: 2021-12-06 | Discharge: 2021-12-06 | Disposition: A | Discharge status: Home / Self Care | Payer: Federal, State, Local not specified - PPO | Source: Ambulatory Visit | Attending: General Surgery | Admitting: General Surgery

## 2021-12-06 DIAGNOSIS — D0511 Intraductal carcinoma in situ of right breast: Secondary | ICD-10-CM | POA: Diagnosis not present

## 2021-12-07 ENCOUNTER — Other Ambulatory Visit: Payer: Self-pay

## 2021-12-07 ENCOUNTER — Encounter (HOSPITAL_BASED_OUTPATIENT_CLINIC_OR_DEPARTMENT_OTHER): Payer: Self-pay | Admitting: General Surgery

## 2021-12-07 ENCOUNTER — Encounter (HOSPITAL_BASED_OUTPATIENT_CLINIC_OR_DEPARTMENT_OTHER)
Admission: RE | Disposition: A | Discharge status: Home / Self Care | Payer: Self-pay | Source: Home / Self Care | Attending: General Surgery

## 2021-12-07 ENCOUNTER — Ambulatory Visit (HOSPITAL_BASED_OUTPATIENT_CLINIC_OR_DEPARTMENT_OTHER)
Admission: RE | Admit: 2021-12-07 | Discharge: 2021-12-07 | Disposition: A | Discharge status: Home / Self Care | Payer: Medicare Other | Attending: General Surgery | Admitting: General Surgery

## 2021-12-07 ENCOUNTER — Ambulatory Visit (HOSPITAL_BASED_OUTPATIENT_CLINIC_OR_DEPARTMENT_OTHER): Payer: Medicare Other | Admitting: Certified Registered"

## 2021-12-07 ENCOUNTER — Ambulatory Visit
Admission: RE | Admit: 2021-12-07 | Discharge: 2021-12-07 | Disposition: A | Discharge status: Home / Self Care | Payer: Federal, State, Local not specified - PPO | Source: Ambulatory Visit | Attending: General Surgery | Admitting: General Surgery

## 2021-12-07 DIAGNOSIS — R921 Mammographic calcification found on diagnostic imaging of breast: Secondary | ICD-10-CM | POA: Diagnosis not present

## 2021-12-07 DIAGNOSIS — D0511 Intraductal carcinoma in situ of right breast: Secondary | ICD-10-CM

## 2021-12-07 DIAGNOSIS — N6011 Diffuse cystic mastopathy of right breast: Secondary | ICD-10-CM | POA: Insufficient documentation

## 2021-12-07 DIAGNOSIS — Z9882 Breast implant status: Secondary | ICD-10-CM | POA: Insufficient documentation

## 2021-12-07 DIAGNOSIS — Z170 Estrogen receptor positive status [ER+]: Secondary | ICD-10-CM | POA: Diagnosis not present

## 2021-12-07 DIAGNOSIS — Z803 Family history of malignant neoplasm of breast: Secondary | ICD-10-CM | POA: Diagnosis not present

## 2021-12-07 HISTORY — DX: Other specified postprocedural states: Z98.890

## 2021-12-07 HISTORY — PX: BREAST LUMPECTOMY WITH RADIOACTIVE SEED LOCALIZATION: SHX6424

## 2021-12-07 HISTORY — DX: Hyperlipidemia, unspecified: E78.5

## 2021-12-07 HISTORY — DX: Dizziness and giddiness: R42

## 2021-12-07 SURGERY — BREAST LUMPECTOMY WITH RADIOACTIVE SEED LOCALIZATION
Anesthesia: General | Site: Breast | Laterality: Right

## 2021-12-07 MED ORDER — FENTANYL CITRATE (PF) 100 MCG/2ML IJ SOLN
INTRAMUSCULAR | Status: AC
  Filled 2021-12-07: qty 2

## 2021-12-07 MED ORDER — PHENYLEPHRINE 80 MCG/ML (10ML) SYRINGE FOR IV PUSH (FOR BLOOD PRESSURE SUPPORT)
PREFILLED_SYRINGE | INTRAVENOUS | Status: AC
  Filled 2021-12-07: qty 10

## 2021-12-07 MED ORDER — ONDANSETRON HCL 4 MG/2ML IJ SOLN
INTRAMUSCULAR | Status: AC
  Filled 2021-12-07: qty 2

## 2021-12-07 MED ORDER — OXYCODONE HCL 5 MG/5ML PO SOLN: 5.0000 mg | Freq: Once | ORAL | Status: DC | PRN

## 2021-12-07 MED ORDER — CHLORHEXIDINE GLUCONATE CLOTH 2 % EX PADS: 6.0000 | MEDICATED_PAD | Freq: Once | CUTANEOUS | Status: DC

## 2021-12-07 MED ORDER — CEFAZOLIN SODIUM-DEXTROSE 2-4 GM/100ML-% IV SOLN
2.0000 g | INTRAVENOUS | Status: AC
  Administered 2021-12-07: 2 g via INTRAVENOUS

## 2021-12-07 MED ORDER — ACETAMINOPHEN 500 MG PO TABS
ORAL_TABLET | ORAL | Status: AC
  Filled 2021-12-07: qty 2

## 2021-12-07 MED ORDER — GABAPENTIN 300 MG PO CAPS
ORAL_CAPSULE | ORAL | Status: AC
  Filled 2021-12-07: qty 1

## 2021-12-07 MED ORDER — BUPIVACAINE-EPINEPHRINE (PF) 0.25% -1:200000 IJ SOLN
INTRAMUSCULAR | Status: DC | PRN
  Administered 2021-12-07: 20 mL via PERINEURAL

## 2021-12-07 MED ORDER — DEXAMETHASONE SODIUM PHOSPHATE 4 MG/ML IJ SOLN
INTRAMUSCULAR | Status: DC | PRN
  Administered 2021-12-07: 8 mg via INTRAVENOUS

## 2021-12-07 MED ORDER — ONDANSETRON HCL 4 MG/2ML IJ SOLN
INTRAMUSCULAR | Status: DC | PRN
  Administered 2021-12-07: 4 mg via INTRAVENOUS

## 2021-12-07 MED ORDER — PROPOFOL 10 MG/ML IV BOLUS
INTRAVENOUS | Status: DC | PRN
  Administered 2021-12-07: 140 mg via INTRAVENOUS

## 2021-12-07 MED ORDER — CEFAZOLIN SODIUM-DEXTROSE 2-4 GM/100ML-% IV SOLN
INTRAVENOUS | Status: AC
  Filled 2021-12-07: qty 100

## 2021-12-07 MED ORDER — ACETAMINOPHEN 500 MG PO TABS
1000.0000 mg | ORAL_TABLET | ORAL | Status: AC
  Administered 2021-12-07: 1000 mg via ORAL

## 2021-12-07 MED ORDER — PROPOFOL 500 MG/50ML IV EMUL
INTRAVENOUS | Status: DC | PRN
  Administered 2021-12-07: 75 ug/kg/min via INTRAVENOUS

## 2021-12-07 MED ORDER — EPHEDRINE SULFATE (PRESSORS) 50 MG/ML IJ SOLN
INTRAMUSCULAR | Status: DC | PRN
  Administered 2021-12-07: 5 mg via INTRAVENOUS

## 2021-12-07 MED ORDER — PHENYLEPHRINE HCL (PRESSORS) 10 MG/ML IV SOLN
INTRAVENOUS | Status: DC | PRN
  Administered 2021-12-07: 160 ug via INTRAVENOUS
  Administered 2021-12-07: 80 ug via INTRAVENOUS
  Administered 2021-12-07 (×2): 160 ug via INTRAVENOUS
  Administered 2021-12-07 (×2): 80 ug via INTRAVENOUS

## 2021-12-07 MED ORDER — PROPOFOL 10 MG/ML IV BOLUS
INTRAVENOUS | Status: AC
  Filled 2021-12-07: qty 20

## 2021-12-07 MED ORDER — EPHEDRINE 5 MG/ML INJ
INTRAVENOUS | Status: AC
  Filled 2021-12-07: qty 5

## 2021-12-07 MED ORDER — OXYCODONE HCL 5 MG PO TABS: 5.0000 mg | ORAL_TABLET | Freq: Four times a day (QID) | ORAL | 0 refills | Status: DC | PRN

## 2021-12-07 MED ORDER — FENTANYL CITRATE (PF) 100 MCG/2ML IJ SOLN
INTRAMUSCULAR | Status: DC | PRN
  Administered 2021-12-07: 50 ug via INTRAVENOUS

## 2021-12-07 MED ORDER — ONDANSETRON HCL 4 MG/2ML IJ SOLN: 4.0000 mg | Freq: Four times a day (QID) | INTRAMUSCULAR | Status: DC | PRN

## 2021-12-07 MED ORDER — CELECOXIB 200 MG PO CAPS
ORAL_CAPSULE | ORAL | Status: AC
  Filled 2021-12-07: qty 1

## 2021-12-07 MED ORDER — FENTANYL CITRATE (PF) 100 MCG/2ML IJ SOLN: 25.0000 ug | INTRAMUSCULAR | Status: DC | PRN

## 2021-12-07 MED ORDER — CELECOXIB 200 MG PO CAPS
200.0000 mg | ORAL_CAPSULE | ORAL | Status: AC
  Administered 2021-12-07: 200 mg via ORAL

## 2021-12-07 MED ORDER — GABAPENTIN 300 MG PO CAPS
300.0000 mg | ORAL_CAPSULE | ORAL | Status: AC
  Administered 2021-12-07: 300 mg via ORAL

## 2021-12-07 MED ORDER — DEXAMETHASONE SODIUM PHOSPHATE 10 MG/ML IJ SOLN
INTRAMUSCULAR | Status: AC
  Filled 2021-12-07: qty 1

## 2021-12-07 MED ORDER — LIDOCAINE 2% (20 MG/ML) 5 ML SYRINGE
INTRAMUSCULAR | Status: DC | PRN
  Administered 2021-12-07: 60 mg via INTRAVENOUS

## 2021-12-07 MED ORDER — LACTATED RINGERS IV SOLN: INTRAVENOUS | Status: DC

## 2021-12-07 MED ORDER — OXYCODONE HCL 5 MG PO TABS: 5.0000 mg | ORAL_TABLET | Freq: Once | ORAL | Status: DC | PRN

## 2021-12-07 SURGICAL SUPPLY — 48 items
ADH SKN CLS APL DERMABOND .7 (GAUZE/BANDAGES/DRESSINGS) ×1
APL PRP STRL LF DISP 70% ISPRP (MISCELLANEOUS) ×1
APPLIER CLIP 9.375 MED OPEN (MISCELLANEOUS) ×2
APR CLP MED 9.3 20 MLT OPN (MISCELLANEOUS) ×1
BINDER BREAST LRG (GAUZE/BANDAGES/DRESSINGS) ×1 IMPLANT
BLADE SURG 15 STRL LF DISP TIS (BLADE) ×1 IMPLANT
BLADE SURG 15 STRL SS (BLADE) ×2
CANISTER SUC SOCK COL 7IN (MISCELLANEOUS) ×1 IMPLANT
CANISTER SUCT 1200ML W/VALVE (MISCELLANEOUS) ×1 IMPLANT
CHLORAPREP W/TINT 26 (MISCELLANEOUS) ×2 IMPLANT
CLIP APPLIE 9.375 MED OPEN (MISCELLANEOUS) IMPLANT
COVER BACK TABLE 60X90IN (DRAPES) ×2 IMPLANT
COVER MAYO STAND STRL (DRAPES) ×2 IMPLANT
COVER PROBE W GEL 5X96 (DRAPES) ×2 IMPLANT
DERMABOND ADVANCED (GAUZE/BANDAGES/DRESSINGS) ×1
DERMABOND ADVANCED .7 DNX12 (GAUZE/BANDAGES/DRESSINGS) ×1 IMPLANT
DRAPE LAPAROSCOPIC ABDOMINAL (DRAPES) ×2 IMPLANT
DRAPE UTILITY XL STRL (DRAPES) ×2 IMPLANT
ELECT COATED BLADE 2.86 ST (ELECTRODE) ×2 IMPLANT
ELECT REM PT RETURN 9FT ADLT (ELECTROSURGICAL) ×2
ELECTRODE REM PT RTRN 9FT ADLT (ELECTROSURGICAL) ×1 IMPLANT
GLOVE BIO SURGEON STRL SZ7.5 (GLOVE) ×4 IMPLANT
GLOVE BIOGEL PI IND STRL 7.0 (GLOVE) IMPLANT
GLOVE BIOGEL PI IND STRL 7.5 (GLOVE) IMPLANT
GLOVE BIOGEL PI INDICATOR 7.0 (GLOVE) ×1
GLOVE BIOGEL PI INDICATOR 7.5 (GLOVE) ×1
GLOVE SURG SS PI 7.0 STRL IVOR (GLOVE) ×1 IMPLANT
GOWN STRL REUS W/ TWL LRG LVL3 (GOWN DISPOSABLE) ×2 IMPLANT
GOWN STRL REUS W/TWL LRG LVL3 (GOWN DISPOSABLE) ×4
ILLUMINATOR WAVEGUIDE N/F (MISCELLANEOUS) IMPLANT
KIT MARKER MARGIN INK (KITS) ×2 IMPLANT
LIGHT WAVEGUIDE WIDE FLAT (MISCELLANEOUS) IMPLANT
NDL HYPO 25X1 1.5 SAFETY (NEEDLE) IMPLANT
NEEDLE HYPO 25X1 1.5 SAFETY (NEEDLE) ×2 IMPLANT
NS IRRIG 1000ML POUR BTL (IV SOLUTION) ×1 IMPLANT
PACK BASIN DAY SURGERY FS (CUSTOM PROCEDURE TRAY) ×2 IMPLANT
PENCIL SMOKE EVACUATOR (MISCELLANEOUS) ×2 IMPLANT
SLEEVE SCD COMPRESS KNEE MED (STOCKING) ×2 IMPLANT
SPIKE FLUID TRANSFER (MISCELLANEOUS) IMPLANT
SPONGE T-LAP 18X18 ~~LOC~~+RFID (SPONGE) ×2 IMPLANT
SUT MON AB 4-0 PC3 18 (SUTURE) ×2 IMPLANT
SUT SILK 2 0 SH (SUTURE) IMPLANT
SUT VICRYL 3-0 CR8 SH (SUTURE) ×2 IMPLANT
SYR CONTROL 10ML LL (SYRINGE) ×1 IMPLANT
TOWEL GREEN STERILE FF (TOWEL DISPOSABLE) ×2 IMPLANT
TRAY FAXITRON CT DISP (TRAY / TRAY PROCEDURE) ×2 IMPLANT
TUBE CONNECTING 20X1/4 (TUBING) ×1 IMPLANT
YANKAUER SUCT BULB TIP NO VENT (SUCTIONS) IMPLANT

## 2021-12-07 NOTE — Anesthesia Postprocedure Evaluation (Signed)
Anesthesia Post Note  Patient: Diane Obrien  Procedure(s) Performed: RIGHT BREAST LUMPECTOMY WITH RADIOACTIVE SEED LOCALIZATION (Right: Breast)     Patient location during evaluation: PACU Anesthesia Type: General Level of consciousness: awake and alert Pain management: pain level controlled Vital Signs Assessment: post-procedure vital signs reviewed and stable Respiratory status: spontaneous breathing, nonlabored ventilation, respiratory function stable and patient connected to nasal cannula oxygen Cardiovascular status: blood pressure returned to baseline and stable Postop Assessment: no apparent nausea or vomiting Anesthetic complications: no   No notable events documented.  Last Vitals:  Vitals:   12/07/21 1400 12/07/21 1415  BP: 128/62 (!) 149/75  Pulse: 65 64  Resp: 13 16  Temp:  36.5 C  SpO2: 98% 97%    Last Pain:  Vitals:   12/07/21 1415  TempSrc:   PainSc: 3                  Barbette Mcglaun S

## 2021-12-07 NOTE — Anesthesia Preprocedure Evaluation (Signed)
Anesthesia Evaluation  Patient identified by MRN, date of birth, ID band Patient awake    Reviewed: Allergy & Precautions, H&P , NPO status , Patient's Chart, lab work & pertinent test results  History of Anesthesia Complications (+) PONV and history of anesthetic complications  Airway Mallampati: II   Neck ROM: full    Dental   Pulmonary former smoker,    breath sounds clear to auscultation       Cardiovascular negative cardio ROS   Rhythm:regular Rate:Normal     Neuro/Psych    GI/Hepatic GERD  ,  Endo/Other    Renal/GU      Musculoskeletal  (+) Arthritis ,   Abdominal   Peds  Hematology   Anesthesia Other Findings   Reproductive/Obstetrics Breast CA                             Anesthesia Physical Anesthesia Plan  ASA: 2  Anesthesia Plan: General   Post-op Pain Management:    Induction: Intravenous  PONV Risk Score and Plan: 4 or greater and Ondansetron, Dexamethasone, Midazolam, Treatment may vary due to age or medical condition and Propofol infusion  Airway Management Planned: LMA  Additional Equipment:   Intra-op Plan:   Post-operative Plan: Extubation in OR  Informed Consent: I have reviewed the patients History and Physical, chart, labs and discussed the procedure including the risks, benefits and alternatives for the proposed anesthesia with the patient or authorized representative who has indicated his/her understanding and acceptance.     Dental advisory given  Plan Discussed with: CRNA, Anesthesiologist and Surgeon  Anesthesia Plan Comments:         Anesthesia Quick Evaluation

## 2021-12-07 NOTE — Interval H&P Note (Signed)
History and Physical Interval Note:  12/07/2021 12:19 PM  Diane Obrien  has presented today for surgery, with the diagnosis of RIGHT BREAST DUCTAL CARCINOMA IN SITU.  The various methods of treatment have been discussed with the patient and family. After consideration of risks, benefits and other options for treatment, the patient has consented to  Procedure(s): RIGHT BREAST LUMPECTOMY WITH RADIOACTIVE SEED LOCALIZATION (Right) as a surgical intervention.  The patient's history has been reviewed, patient examined, no change in status, stable for surgery.  I have reviewed the patient's chart and labs.  Questions were answered to the patient's satisfaction.     Autumn Messing III

## 2021-12-07 NOTE — Discharge Instructions (Signed)
  Post Anesthesia Home Care Instructions  Activity: Get plenty of rest for the remainder of the day. A responsible individual must stay with you for 24 hours following the procedure.  For the next 24 hours, DO NOT: -Drive a car -Paediatric nurse -Drink alcoholic beverages -Take any medication unless instructed by your physician -Make any legal decisions or sign important papers.  Meals: Start with liquid foods such as gelatin or soup. Progress to regular foods as tolerated. Avoid greasy, spicy, heavy foods. If nausea and/or vomiting occur, drink only clear liquids until the nausea and/or vomiting subsides. Call your physician if vomiting continues.  Special Instructions/Symptoms: Your throat may feel dry or sore from the anesthesia or the breathing tube placed in your throat during surgery. If this causes discomfort, gargle with warm salt water. The discomfort should disappear within 24 hours.  If you had a scopolamine patch placed behind your ear for the management of post- operative nausea and/or vomiting:  1. The medication in the patch is effective for 72 hours, after which it should be removed.  Wrap patch in a tissue and discard in the trash. Wash hands thoroughly with soap and water. 2. You may remove the patch earlier than 72 hours if you experience unpleasant side effects which may include dry mouth, dizziness or visual disturbances. 3. Avoid touching the patch. Wash your hands with soap and water after contact with the patch.  No tylenol or ibuprofen until after 6pm tonight

## 2021-12-07 NOTE — Op Note (Signed)
12/07/2021  1:21 PM  PATIENT:  Diane Obrien  71 y.o. female  PRE-OPERATIVE DIAGNOSIS:  RIGHT BREAST DUCTAL CARCINOMA IN SITU  POST-OPERATIVE DIAGNOSIS:  RIGHT BREAST DUCTAL CARCINOMA IN SITU  PROCEDURE:  Procedure(s): RIGHT BREAST LUMPECTOMY WITH RADIOACTIVE SEED LOCALIZATION (Right)  SURGEON:  Surgeon(s) and Role:    * Jovita Kussmaul, MD - Primary  PHYSICIAN ASSISTANT:   ASSISTANTS: none   ANESTHESIA:   local and general  EBL:  minimal   BLOOD ADMINISTERED:none  DRAINS: none   LOCAL MEDICATIONS USED:  MARCAINE     SPECIMEN:  Source of Specimen:  right breast tissue  DISPOSITION OF SPECIMEN:  PATHOLOGY  COUNTS:  YES  TOURNIQUET:  * No tourniquets in log *  DICTATION: .Dragon Dictation  After informed consent was obtained the patient was brought to the operating room and placed in the supine position on the operating table.  After adequate induction of general anesthesia the patient's right breast was prepped with ChloraPrep, allowed to dry, and draped in usual sterile manner.  An appropriate timeout was performed.  Previously an I-125 seed was placed in the upper outer central right breast to mark an area of ductal carcinoma in situ.  The neoprobe was set to I-125 in the area of radioactivity was readily identified.  The area around this was infiltrated with quarter percent Marcaine.  A curvilinear incision was made along the upper outer edge of the areola with a 15 blade knife.  The incision was carried through the skin and subcutaneous tissue sharply with the electrocautery.  Dissection was carried throughout the upper outer quadrant between the breast tissue and the subcutaneous fat and skin.  This dissection was carried beyond the area of the cancer.  I then removed a circular portion of breast tissue sharply with the electrocautery around the radioactive seed while checking the area of radioactivity frequently.  This dissection was carried all the way to the muscle of  the chest wall.  Once the specimen was removed it was oriented with the appropriate paint colors.  A specimen radiograph was obtained that showed the clip and seed to be near the center of the specimen.  The specimen was then sent to pathology for further evaluation.  Hemostasis was achieved using the Bovie electrocautery.  The wound was then irrigated with saline and infiltrated with more quarter percent Marcaine.  The cavity was marked with clips.  The deep layer of the incision was then closed with layers of interrupted 3-0 Vicryl stitches.  The skin was then closed with interrupted 4-0 Monocryl subcuticular stitches.  Dermabond dressings were applied.  The patient tolerated the procedure well.  At the end of the case all needle sponge and instrument counts were correct.  The patient was then awakened and taken to recovery in stable condition.  PLAN OF CARE: Discharge to home after PACU  PATIENT DISPOSITION:  PACU - hemodynamically stable.   Delay start of Pharmacological VTE agent (>24hrs) due to surgical blood loss or risk of bleeding: not applicable

## 2021-12-07 NOTE — Anesthesia Procedure Notes (Signed)
Procedure Name: LMA Insertion Date/Time: 12/07/2021 12:46 PM  Performed by: Ezequiel Kayser, CRNAPre-anesthesia Checklist: Patient identified, Emergency Drugs available, Suction available and Patient being monitored Patient Re-evaluated:Patient Re-evaluated prior to induction Oxygen Delivery Method: Circle System Utilized Preoxygenation: Pre-oxygenation with 100% oxygen Induction Type: IV induction Ventilation: Mask ventilation without difficulty LMA: LMA inserted LMA Size: 4.0 Number of attempts: 1 Airway Equipment and Method: Bite block Placement Confirmation: positive ETCO2 Tube secured with: Tape Dental Injury: Teeth and Oropharynx as per pre-operative assessment

## 2021-12-07 NOTE — Transfer of Care (Signed)
Immediate Anesthesia Transfer of Care Note  Patient: Diane Obrien  Procedure(s) Performed: RIGHT BREAST LUMPECTOMY WITH RADIOACTIVE SEED LOCALIZATION (Right: Breast)  Patient Location: PACU  Anesthesia Type:General  Level of Consciousness: drowsy  Airway & Oxygen Therapy: Patient Spontanous Breathing and Patient connected to face mask oxygen  Post-op Assessment: Report given to RN and Post -op Vital signs reviewed and stable  Post vital signs: Reviewed and stable  Last Vitals:  Vitals Value Taken Time  BP 116/63   Temp    Pulse 73 12/07/21 1330  Resp 21 12/07/21 1330  SpO2 100 % 12/07/21 1330  Vitals shown include unvalidated device data.  Last Pain:  Vitals:   12/07/21 1146  TempSrc: Oral  PainSc: 0-No pain         Complications: No notable events documented.

## 2021-12-07 NOTE — H&P (Signed)
REFERRING PHYSICIAN: Daria Pastures, MD  PROVIDER: Landry Corporal, MD  MRN: [id number] DOB: Jan 23, 1951 Subjective   Chief Complaint: Breast Cancer   History of Present Illness: Diane Obrien is a 71 y.o. female who is seen today as an office consultation for evaluation of Breast Cancer .   We are asked to see the patient in consultation by Dr. Bobbye Charleston to evaluate her for a new right breast cancer. The patient is a 71 year old white female who recently went for a routine screening mammogram. At that time she was found to have a new cluster of calcifications in the upper outer quadrant of the right breast that measured 1.6 cm. This was biopsied and came back as ductal carcinoma in situ that was ER and PR positive. She does have a history of breast implants that have been removed twice. She has a family history of breast cancer in her mother. She is otherwise in good health and does not smoke.  Review of Systems: A complete review of systems was obtained from the patient. I have reviewed this information and discussed as appropriate with the patient. See HPI as well for other ROS.  ROS   Medical History: Past Medical History:  Diagnosis Date   GERD (gastroesophageal reflux disease)   Patient Active Problem List  Diagnosis   Ductal carcinoma in situ (DCIS) of right breast   History reviewed. No pertinent surgical history.   No Known Allergies  Current Outpatient Medications on File Prior to Visit  Medication Sig Dispense Refill   nitrofurantoin, macrocrystal-monohydrate, (MACROBID) 100 MG capsule   No current facility-administered medications on file prior to visit.   Family History  Problem Relation Age of Onset   Breast cancer Mother   Diabetes Father    Social History   Tobacco Use  Smoking Status Never  Smokeless Tobacco Never    Social History   Socioeconomic History   Marital status: Married  Tobacco Use   Smoking status: Never    Smokeless tobacco: Never   Objective:   Vitals:  BP: (!) 188/90  Pulse: (!) 113  Weight: 65.9 kg (145 lb 3.2 oz)  Height: 162.6 cm ('5\' 4"'$ )   Body mass index is 24.92 kg/m.  Physical Exam Vitals reviewed.  Constitutional:  General: She is not in acute distress. Appearance: Normal appearance.  HENT:  Head: Normocephalic and atraumatic.  Right Ear: External ear normal.  Left Ear: External ear normal.  Nose: Nose normal.  Mouth/Throat:  Mouth: Mucous membranes are moist.  Pharynx: Oropharynx is clear.  Eyes:  General: No scleral icterus. Extraocular Movements: Extraocular movements intact.  Conjunctiva/sclera: Conjunctivae normal.  Pupils: Pupils are equal, round, and reactive to light.  Cardiovascular:  Rate and Rhythm: Normal rate and regular rhythm.  Pulses: Normal pulses.  Heart sounds: Normal heart sounds.  Pulmonary:  Effort: Pulmonary effort is normal. No respiratory distress.  Breath sounds: Normal breath sounds.  Abdominal:  General: Bowel sounds are normal.  Palpations: Abdomen is soft.  Tenderness: There is no abdominal tenderness.  Musculoskeletal:  General: No swelling, tenderness or deformity. Normal range of motion.  Cervical back: Normal range of motion and neck supple.  Skin: General: Skin is warm and dry.  Coloration: Skin is not jaundiced.  Neurological:  General: No focal deficit present.  Mental Status: She is alert and oriented to person, place, and time.  Psychiatric:  Mood and Affect: Mood normal.  Behavior: Behavior normal.     Breast: There are well-healed inframammary  fold scars. There is no palpable mass in either breast. There is no palpable axillary, supraclavicular, or cervical lymphadenopathy.  Labs, Imaging and Diagnostic Testing:  Assessment and Plan:   Diagnoses and all orders for this visit:  Ductal carcinoma in situ (DCIS) of right breast    The patient appears to have a 1.6 cm area of ductal carcinoma in situ in  the upper outer quadrant of the right breast. I have discussed with her in detail the different options for treatment and at this point she is leaning towards breast conservation. She will not need a node evaluation. I have discussed with her in detail the risks and benefits of the operation as well as some of the technical aspects including the use of a radioactive seed for localization and she understands. She will talk with her family and then let us know her final decision. In the meantime I will go ahead and refer her to medical and radiation oncology to talk about adjuvant therapy.

## 2021-12-12 ENCOUNTER — Ambulatory Visit: Payer: Self-pay | Admitting: General Surgery

## 2021-12-12 LAB — SURGICAL PATHOLOGY

## 2021-12-13 ENCOUNTER — Encounter: Payer: Self-pay | Admitting: *Deleted

## 2021-12-17 ENCOUNTER — Encounter (HOSPITAL_BASED_OUTPATIENT_CLINIC_OR_DEPARTMENT_OTHER): Payer: Self-pay | Admitting: General Surgery

## 2021-12-20 ENCOUNTER — Encounter: Payer: Self-pay | Admitting: *Deleted

## 2021-12-25 ENCOUNTER — Encounter (HOSPITAL_COMMUNITY): Payer: Self-pay

## 2021-12-25 ENCOUNTER — Telehealth: Payer: Self-pay | Admitting: Hematology and Oncology

## 2021-12-25 NOTE — Telephone Encounter (Signed)
Called per 7/17 inbasket , pt request to r/s appt due to extra surgery, navigator notified

## 2021-12-28 ENCOUNTER — Encounter (HOSPITAL_BASED_OUTPATIENT_CLINIC_OR_DEPARTMENT_OTHER): Payer: Self-pay | Admitting: General Surgery

## 2021-12-28 ENCOUNTER — Other Ambulatory Visit: Payer: Self-pay

## 2022-01-01 NOTE — Progress Notes (Signed)
Surgical soap given with instructions, pt verbalized understanding.  

## 2022-01-02 ENCOUNTER — Ambulatory Visit: Payer: Medicare Other | Admitting: Radiation Oncology

## 2022-01-02 ENCOUNTER — Ambulatory Visit: Payer: Medicare Other

## 2022-01-03 ENCOUNTER — Ambulatory Visit: Payer: Federal, State, Local not specified - PPO | Admitting: Radiation Oncology

## 2022-01-04 ENCOUNTER — Ambulatory Visit (HOSPITAL_BASED_OUTPATIENT_CLINIC_OR_DEPARTMENT_OTHER)
Admission: RE | Admit: 2022-01-04 | Discharge: 2022-01-04 | Disposition: A | Discharge status: Home / Self Care | Payer: Medicare Other | Attending: General Surgery | Admitting: General Surgery

## 2022-01-04 ENCOUNTER — Ambulatory Visit (HOSPITAL_BASED_OUTPATIENT_CLINIC_OR_DEPARTMENT_OTHER): Payer: Medicare Other | Admitting: Anesthesiology

## 2022-01-04 ENCOUNTER — Encounter (HOSPITAL_BASED_OUTPATIENT_CLINIC_OR_DEPARTMENT_OTHER): Payer: Self-pay | Admitting: General Surgery

## 2022-01-04 ENCOUNTER — Other Ambulatory Visit: Payer: Self-pay

## 2022-01-04 ENCOUNTER — Encounter (HOSPITAL_BASED_OUTPATIENT_CLINIC_OR_DEPARTMENT_OTHER)
Admission: RE | Disposition: A | Discharge status: Home / Self Care | Payer: Self-pay | Source: Home / Self Care | Attending: General Surgery

## 2022-01-04 DIAGNOSIS — D0511 Intraductal carcinoma in situ of right breast: Secondary | ICD-10-CM | POA: Diagnosis not present

## 2022-01-04 DIAGNOSIS — Z803 Family history of malignant neoplasm of breast: Secondary | ICD-10-CM | POA: Insufficient documentation

## 2022-01-04 DIAGNOSIS — Z170 Estrogen receptor positive status [ER+]: Secondary | ICD-10-CM | POA: Insufficient documentation

## 2022-01-04 HISTORY — PX: RE-EXCISION OF BREAST LUMPECTOMY: SHX6048

## 2022-01-04 SURGERY — EXCISION, LESION, BREAST
Anesthesia: General | Site: Breast | Laterality: Right

## 2022-01-04 MED ORDER — CEFAZOLIN SODIUM-DEXTROSE 2-4 GM/100ML-% IV SOLN
2.0000 g | INTRAVENOUS | Status: AC
  Administered 2022-01-04: 2 g via INTRAVENOUS

## 2022-01-04 MED ORDER — LACTATED RINGERS IV SOLN: INTRAVENOUS | Status: DC

## 2022-01-04 MED ORDER — FENTANYL CITRATE (PF) 100 MCG/2ML IJ SOLN
INTRAMUSCULAR | Status: DC | PRN
  Administered 2022-01-04: 50 ug via INTRAVENOUS

## 2022-01-04 MED ORDER — CHLORHEXIDINE GLUCONATE CLOTH 2 % EX PADS: 6.0000 | MEDICATED_PAD | Freq: Once | CUTANEOUS | Status: DC

## 2022-01-04 MED ORDER — DEXAMETHASONE SODIUM PHOSPHATE 4 MG/ML IJ SOLN
INTRAMUSCULAR | Status: DC | PRN
  Administered 2022-01-04: 4 mg via INTRAVENOUS

## 2022-01-04 MED ORDER — OXYCODONE HCL 5 MG PO TABS: 5.0000 mg | ORAL_TABLET | Freq: Four times a day (QID) | ORAL | 0 refills | Status: DC | PRN

## 2022-01-04 MED ORDER — PROPOFOL 10 MG/ML IV BOLUS
INTRAVENOUS | Status: DC | PRN
  Administered 2022-01-04: 100 mg via INTRAVENOUS

## 2022-01-04 MED ORDER — PROPOFOL 500 MG/50ML IV EMUL
INTRAVENOUS | Status: DC | PRN
  Administered 2022-01-04: 135 ug/kg/min via INTRAVENOUS

## 2022-01-04 MED ORDER — GABAPENTIN 300 MG PO CAPS
ORAL_CAPSULE | ORAL | Status: AC
  Filled 2022-01-04: qty 1

## 2022-01-04 MED ORDER — CEFAZOLIN SODIUM-DEXTROSE 2-4 GM/100ML-% IV SOLN
INTRAVENOUS | Status: AC
  Filled 2022-01-04: qty 100

## 2022-01-04 MED ORDER — PHENYLEPHRINE HCL (PRESSORS) 10 MG/ML IV SOLN
INTRAVENOUS | Status: DC | PRN
  Administered 2022-01-04 (×2): 40 ug via INTRAVENOUS

## 2022-01-04 MED ORDER — GABAPENTIN 300 MG PO CAPS: 300.0000 mg | ORAL_CAPSULE | ORAL | Status: DC

## 2022-01-04 MED ORDER — AMISULPRIDE (ANTIEMETIC) 5 MG/2ML IV SOLN: 10.0000 mg | Freq: Once | INTRAVENOUS | Status: DC | PRN

## 2022-01-04 MED ORDER — FENTANYL CITRATE (PF) 100 MCG/2ML IJ SOLN
INTRAMUSCULAR | Status: AC
  Filled 2022-01-04: qty 2

## 2022-01-04 MED ORDER — ACETAMINOPHEN 500 MG PO TABS: 1000.0000 mg | ORAL_TABLET | ORAL | Status: DC

## 2022-01-04 MED ORDER — ACETAMINOPHEN 500 MG PO TABS
ORAL_TABLET | ORAL | Status: AC
Start: 2022-01-04 — End: ?
  Filled 2022-01-04: qty 2

## 2022-01-04 MED ORDER — OXYCODONE HCL 5 MG/5ML PO SOLN: 5.0000 mg | Freq: Once | ORAL | Status: DC | PRN

## 2022-01-04 MED ORDER — BUPIVACAINE-EPINEPHRINE 0.25% -1:200000 IJ SOLN
INTRAMUSCULAR | Status: DC | PRN
  Administered 2022-01-04: 10 mL

## 2022-01-04 MED ORDER — FENTANYL CITRATE (PF) 100 MCG/2ML IJ SOLN: 25.0000 ug | INTRAMUSCULAR | Status: DC | PRN

## 2022-01-04 MED ORDER — OXYCODONE HCL 5 MG PO TABS: 5.0000 mg | ORAL_TABLET | Freq: Once | ORAL | Status: DC | PRN

## 2022-01-04 MED ORDER — ONDANSETRON HCL 4 MG/2ML IJ SOLN
INTRAMUSCULAR | Status: DC | PRN
  Administered 2022-01-04: 4 mg via INTRAVENOUS

## 2022-01-04 MED ORDER — LIDOCAINE HCL (CARDIAC) PF 100 MG/5ML IV SOSY
PREFILLED_SYRINGE | INTRAVENOUS | Status: DC | PRN
  Administered 2022-01-04: 60 mg via INTRAVENOUS

## 2022-01-04 SURGICAL SUPPLY — 42 items
ADH SKN CLS APL DERMABOND .7 (GAUZE/BANDAGES/DRESSINGS) ×1
APL PRP STRL LF DISP 70% ISPRP (MISCELLANEOUS) ×1
BINDER BREAST LRG (GAUZE/BANDAGES/DRESSINGS) ×1 IMPLANT
BLADE SURG 15 STRL LF DISP TIS (BLADE) ×1 IMPLANT
BLADE SURG 15 STRL SS (BLADE) ×2
CANISTER SUCT 1200ML W/VALVE (MISCELLANEOUS) ×2 IMPLANT
CHLORAPREP W/TINT 26 (MISCELLANEOUS) ×2 IMPLANT
CLIP TI WIDE RED SMALL 6 (CLIP) IMPLANT
COVER BACK TABLE 60X90IN (DRAPES) ×2 IMPLANT
COVER MAYO STAND STRL (DRAPES) ×2 IMPLANT
DERMABOND ADVANCED (GAUZE/BANDAGES/DRESSINGS) ×1
DERMABOND ADVANCED .7 DNX12 (GAUZE/BANDAGES/DRESSINGS) ×1 IMPLANT
DRAPE LAPAROSCOPIC ABDOMINAL (DRAPES) ×2 IMPLANT
DRAPE UTILITY XL STRL (DRAPES) ×2 IMPLANT
ELECT COATED BLADE 2.86 ST (ELECTRODE) ×2 IMPLANT
ELECT REM PT RETURN 9FT ADLT (ELECTROSURGICAL) ×2
ELECTRODE REM PT RTRN 9FT ADLT (ELECTROSURGICAL) ×1 IMPLANT
GLOVE BIO SURGEON STRL SZ7.5 (GLOVE) ×2 IMPLANT
GLOVE BIOGEL PI IND STRL 7.0 (GLOVE) IMPLANT
GLOVE BIOGEL PI INDICATOR 7.0 (GLOVE) ×2
GOWN STRL REUS W/ TWL LRG LVL3 (GOWN DISPOSABLE) ×2 IMPLANT
GOWN STRL REUS W/TWL LRG LVL3 (GOWN DISPOSABLE) ×6
ILLUMINATOR WAVEGUIDE N/F (MISCELLANEOUS) IMPLANT
KIT MARKER MARGIN INK (KITS) ×1 IMPLANT
LIGHT WAVEGUIDE WIDE FLAT (MISCELLANEOUS) IMPLANT
NDL HYPO 25X1 1.5 SAFETY (NEEDLE) ×1 IMPLANT
NEEDLE HYPO 25X1 1.5 SAFETY (NEEDLE) ×2 IMPLANT
NS IRRIG 1000ML POUR BTL (IV SOLUTION) IMPLANT
PACK BASIN DAY SURGERY FS (CUSTOM PROCEDURE TRAY) ×2 IMPLANT
PENCIL SMOKE EVACUATOR (MISCELLANEOUS) ×2 IMPLANT
SLEEVE SCD COMPRESS KNEE MED (STOCKING) ×2 IMPLANT
SPIKE FLUID TRANSFER (MISCELLANEOUS) ×1 IMPLANT
SPONGE T-LAP 18X18 ~~LOC~~+RFID (SPONGE) ×2 IMPLANT
STAPLER VISISTAT 35W (STAPLE) IMPLANT
SUT MON AB 4-0 PC3 18 (SUTURE) ×2 IMPLANT
SUT SILK 2 0 SH (SUTURE) IMPLANT
SUT VICRYL 3-0 CR8 SH (SUTURE) ×2 IMPLANT
SYR CONTROL 10ML LL (SYRINGE) ×2 IMPLANT
TOWEL GREEN STERILE FF (TOWEL DISPOSABLE) ×2 IMPLANT
TRAY FAXITRON CT DISP (TRAY / TRAY PROCEDURE) IMPLANT
TUBE CONNECTING 20X1/4 (TUBING) ×2 IMPLANT
YANKAUER SUCT BULB TIP NO VENT (SUCTIONS) ×2 IMPLANT

## 2022-01-04 NOTE — Interval H&P Note (Signed)
History and Physical Interval Note:  01/04/2022 1:34 PM  Diane Obrien  has presented today for surgery, with the diagnosis of RIGHT BREAST DUCTAL CARCINOMA IN SITU.  The various methods of treatment have been discussed with the patient and family. After consideration of risks, benefits and other options for treatment, the patient has consented to  Procedure(s): RE-EXCISION RIGHT BREAST INFERIOR MARGINS (Right) as a surgical intervention.  The patient's history has been reviewed, patient examined, no change in status, stable for surgery.  I have reviewed the patient's chart and labs.  Questions were answered to the patient's satisfaction.     Autumn Messing III

## 2022-01-04 NOTE — Discharge Instructions (Addendum)
Tylenol 1000 mg given at 1248 p.m.  Post Anesthesia Home Care Instructions  Activity: Get plenty of rest for the remainder of the day. A responsible individual must stay with you for 24 hours following the procedure.  For the next 24 hours, DO NOT: -Drive a car -Paediatric nurse -Drink alcoholic beverages -Take any medication unless instructed by your physician -Make any legal decisions or sign important papers.  Meals: Start with liquid foods such as gelatin or soup. Progress to regular foods as tolerated. Avoid greasy, spicy, heavy foods. If nausea and/or vomiting occur, drink only clear liquids until the nausea and/or vomiting subsides. Call your physician if vomiting continues.  Special Instructions/Symptoms: Your throat may feel dry or sore from the anesthesia or the breathing tube placed in your throat during surgery. If this causes discomfort, gargle with warm salt water. The discomfort should disappear within 24 hours.  If you had a scopolamine patch placed behind your ear for the management of post- operative nausea and/or vomiting:  1. The medication in the patch is effective for 72 hours, after which it should be removed.  Wrap patch in a tissue and discard in the trash. Wash hands thoroughly with soap and water. 2. You may remove the patch earlier than 72 hours if you experience unpleasant side effects which may include dry mouth, dizziness or visual disturbances. 3. Avoid touching the patch. Wash your hands with soap and water after contact with the patch.

## 2022-01-04 NOTE — Anesthesia Postprocedure Evaluation (Signed)
Anesthesia Post Note  Patient: Diane Obrien  Procedure(s) Performed: RE-EXCISION RIGHT BREAST INFERIOR MARGINS (Right: Breast)     Patient location during evaluation: PACU Anesthesia Type: General Level of consciousness: awake and alert Pain management: pain level controlled Vital Signs Assessment: post-procedure vital signs reviewed and stable Respiratory status: spontaneous breathing, nonlabored ventilation and respiratory function stable Cardiovascular status: blood pressure returned to baseline Postop Assessment: no apparent nausea or vomiting Anesthetic complications: no   No notable events documented.  Last Vitals:  Vitals:   01/04/22 1445 01/04/22 1515  BP: (!) 109/55 126/68  Pulse: 81 67  Resp: (!) 21 16  Temp:    SpO2: 100% 100%    Last Pain:  Vitals:   01/04/22 1515  TempSrc:   PainSc: 2                  Marthenia Rolling

## 2022-01-04 NOTE — Anesthesia Preprocedure Evaluation (Addendum)
Anesthesia Evaluation  Patient identified by MRN, date of birth, ID band Patient awake    Reviewed: Allergy & Precautions, NPO status , Patient's Chart, lab work & pertinent test results  History of Anesthesia Complications (+) PONV and history of anesthetic complications  Airway Mallampati: II  TM Distance: >3 FB Neck ROM: Full    Dental no notable dental hx.    Pulmonary former smoker,    Pulmonary exam normal        Cardiovascular negative cardio ROS Normal cardiovascular exam     Neuro/Psych negative neurological ROS  negative psych ROS   GI/Hepatic Neg liver ROS, GERD  Controlled,  Endo/Other  negative endocrine ROS  Renal/GU negative Renal ROS  negative genitourinary   Musculoskeletal  (+) Arthritis ,   Abdominal   Peds  Hematology negative hematology ROS (+)   Anesthesia Other Findings RIGHT BREAST DUCTAL CARCINOMA IN SITU  Reproductive/Obstetrics negative OB ROS                            Anesthesia Physical Anesthesia Plan  ASA: 2  Anesthesia Plan: General   Post-op Pain Management: Tylenol PO (pre-op)*   Induction: Intravenous  PONV Risk Score and Plan: 4 or greater and Treatment may vary due to age or medical condition, Ondansetron, Dexamethasone, Midazolam, Propofol infusion and TIVA  Airway Management Planned: LMA  Additional Equipment: None  Intra-op Plan:   Post-operative Plan: Extubation in OR  Informed Consent: I have reviewed the patients History and Physical, chart, labs and discussed the procedure including the risks, benefits and alternatives for the proposed anesthesia with the patient or authorized representative who has indicated his/her understanding and acceptance.     Dental advisory given  Plan Discussed with: CRNA  Anesthesia Plan Comments:        Anesthesia Quick Evaluation

## 2022-01-04 NOTE — H&P (Signed)
PROVIDER: Landry Corporal, MD  MRN: [id number] DOB: [birth year] Subjective   Chief Complaint: Post Operative Visit   History of Present Illness: Diane Obrien is a 71 y.o. female who is seen today for right breast cancer. The patient is a 71 year old white female who is about 3 weeks status post right breast lumpectomy for ductal carcinoma in situ that was ER and PR positive. She did have a positive inferior margin. She is scheduled to have this reexcised on Friday. She tolerated the surgery well.    Review of Systems: A complete review of systems was obtained from the patient. I have reviewed this information and discussed as appropriate with the patient. See HPI as well for other ROS.  ROS   Medical History: Past Medical History:  Diagnosis Date  GERD (gastroesophageal reflux disease)   Patient Active Problem List  Diagnosis  Ductal carcinoma in situ (DCIS) of right breast   History reviewed. No pertinent surgical history.   No Known Allergies  Current Outpatient Medications on File Prior to Visit  Medication Sig Dispense Refill  nitrofurantoin, macrocrystal-monohydrate, (MACROBID) 100 MG capsule (Patient not taking: Reported on 01/01/2022)   No current facility-administered medications on file prior to visit.   Family History  Problem Relation Age of Onset  Breast cancer Mother  Diabetes Father    Social History   Tobacco Use  Smoking Status Never  Smokeless Tobacco Never    Social History   Socioeconomic History  Marital status: Married  Tobacco Use  Smoking status: Never  Smokeless tobacco: Never   Objective:   There were no vitals filed for this visit.  There is no height or weight on file to calculate BMI.  Physical Exam Vitals reviewed.  Constitutional:  General: She is not in acute distress. Appearance: Normal appearance.  HENT:  Head: Normocephalic and atraumatic.  Right Ear: External ear normal.  Left Ear: External ear normal.   Nose: Nose normal.  Mouth/Throat:  Mouth: Mucous membranes are moist.  Pharynx: Oropharynx is clear.  Eyes:  General: No scleral icterus. Extraocular Movements: Extraocular movements intact.  Conjunctiva/sclera: Conjunctivae normal.  Pupils: Pupils are equal, round, and reactive to light.  Cardiovascular:  Rate and Rhythm: Normal rate and regular rhythm.  Pulses: Normal pulses.  Heart sounds: Normal heart sounds.  Pulmonary:  Effort: Pulmonary effort is normal. No respiratory distress.  Breath sounds: Normal breath sounds.  Abdominal:  General: Bowel sounds are normal.  Palpations: Abdomen is soft.  Tenderness: There is no abdominal tenderness.  Musculoskeletal:  General: No swelling, tenderness or deformity. Normal range of motion.  Cervical back: Normal range of motion and neck supple.  Skin: General: Skin is warm and dry.  Coloration: Skin is not jaundiced.  Neurological:  General: No focal deficit present.  Mental Status: She is alert and oriented to person, place, and time.  Psychiatric:  Mood and Affect: Mood normal.  Behavior: Behavior normal.    Breast: The upper periareolar incision is healing nicely on the right breast with no sign of infection or seroma  Labs, Imaging and Diagnostic Testing:  Assessment and Plan:   Diagnoses and all orders for this visit:  Ductal carcinoma in situ (DCIS) of right breast    The patient is about 3 weeks status post right breast lumpectomy for ductal carcinoma in situ. She tolerated the surgery well. She did have a positive inferior margin. She is scheduled to have this margin reexcised on Friday. I have discussed with her in detail the  risks and benefits of the operation as well as some of the technical aspects and she understands and wishes to proceed.

## 2022-01-04 NOTE — Op Note (Signed)
01/04/2022  2:36 PM  PATIENT:  Diane Obrien  71 y.o. female  PRE-OPERATIVE DIAGNOSIS:  RIGHT BREAST DUCTAL CARCINOMA IN SITU WITH POSITIVE INFERIOR MARGIN  POST-OPERATIVE DIAGNOSIS:  RIGHT BREAST DUCTAL CARCINOMA IN SITU WITH POSITIVE INFERIOR MARGIN  PROCEDURE:  Procedure(s): RE-EXCISION RIGHT BREAST INFERIOR MARGINS (Right)  SURGEON:  Surgeon(s) and Role:    * Jovita Kussmaul, MD - Primary  PHYSICIAN ASSISTANT:   ASSISTANTS: none   ANESTHESIA:   local and general  EBL:  minimal   BLOOD ADMINISTERED:none  DRAINS: none   LOCAL MEDICATIONS USED:  MARCAINE     SPECIMEN:  Source of Specimen:  right breast inferior margin  DISPOSITION OF SPECIMEN:  PATHOLOGY  COUNTS:  YES  TOURNIQUET:  * No tourniquets in log *  DICTATION: .Dragon Dictation  After informed consent was obtained the patient was brought to the operating room and placed in the supine position on the operating table.  After adequate induction of general anesthesia the patient's right breast was prepped with ChloraPrep, allowed to dry, and draped in usual sterile manner.  An appropriate timeout was performed.  The right breast around her previous incision was infiltrated with quarter percent Marcaine.  The upper and outer periareolar incision was reopened with a 15 blade knife.  The cavity was then probed with a hemostat until the previous lumpectomy closure was identified and reopened.  The inferior edge of the cavity was then reexcised sharply with the electrocautery.  The specimen was then marked with the appropriate paint color.  The specimen was then sent to pathology for further evaluation.  Hemostasis was achieved using the Bovie electrocautery.  The wound was irrigated with saline and infiltrated with more quarter percent Marcaine.  The deep layer of the wound was then closed with interrupted 3-0 Vicryl stitches.  The skin was closed with interrupted 4-0 Monocryl subcuticular stitches.  Dermabond dressings were  applied.  The patient tolerated the procedure well.  At the end of the case all needle sponge and instrument counts were correct.  Patient was then awakened and taken to recovery in stable condition.  PLAN OF CARE: Discharge to home after PACU  PATIENT DISPOSITION:  PACU - hemodynamically stable.   Delay start of Pharmacological VTE agent (>24hrs) due to surgical blood loss or risk of bleeding: not applicable

## 2022-01-04 NOTE — Transfer of Care (Signed)
Immediate Anesthesia Transfer of Care Note  Patient: Diane Obrien  Procedure(s) Performed: RE-EXCISION RIGHT BREAST INFERIOR MARGINS (Right: Breast)  Patient Location: PACU  Anesthesia Type:General  Level of Consciousness: drowsy and patient cooperative  Airway & Oxygen Therapy: Patient Spontanous Breathing and Patient connected to face mask oxygen  Post-op Assessment: Report given to RN and Post -op Vital signs reviewed and stable  Post vital signs: Reviewed and stable  Last Vitals:  Vitals Value Taken Time  BP 90/41 01/04/22 1440  Temp    Pulse 71 01/04/22 1441  Resp 23 01/04/22 1441  SpO2 98 % 01/04/22 1441  Vitals shown include unvalidated device data.  Last Pain:  Vitals:   01/04/22 1243  TempSrc: Oral  PainSc: 0-No pain      Patients Stated Pain Goal: 3 (04/26/34 6701)  Complications: No notable events documented.

## 2022-01-04 NOTE — Anesthesia Procedure Notes (Signed)
Procedure Name: LMA Insertion Date/Time: 01/04/2022 2:00 PM  Performed by: Larone Kliethermes, Ernesta Amble, CRNAPre-anesthesia Checklist: Patient identified, Emergency Drugs available, Suction available and Patient being monitored Patient Re-evaluated:Patient Re-evaluated prior to induction Oxygen Delivery Method: Circle system utilized Preoxygenation: Pre-oxygenation with 100% oxygen Induction Type: IV induction Ventilation: Mask ventilation without difficulty LMA: LMA inserted LMA Size: 4.0 Number of attempts: 1 Airway Equipment and Method: Bite block Placement Confirmation: positive ETCO2 Tube secured with: Tape Dental Injury: Teeth and Oropharynx as per pre-operative assessment

## 2022-01-07 ENCOUNTER — Encounter (HOSPITAL_BASED_OUTPATIENT_CLINIC_OR_DEPARTMENT_OTHER): Payer: Self-pay | Admitting: General Surgery

## 2022-01-07 LAB — SURGICAL PATHOLOGY

## 2022-01-09 ENCOUNTER — Encounter: Payer: Self-pay | Admitting: *Deleted

## 2022-01-25 ENCOUNTER — Ambulatory Visit: Payer: Medicare Other | Admitting: Hematology and Oncology

## 2022-01-29 DIAGNOSIS — H2513 Age-related nuclear cataract, bilateral: Secondary | ICD-10-CM | POA: Diagnosis not present

## 2022-01-29 DIAGNOSIS — H353112 Nonexudative age-related macular degeneration, right eye, intermediate dry stage: Secondary | ICD-10-CM | POA: Diagnosis not present

## 2022-01-29 DIAGNOSIS — H353121 Nonexudative age-related macular degeneration, left eye, early dry stage: Secondary | ICD-10-CM | POA: Diagnosis not present

## 2022-01-29 DIAGNOSIS — H02422 Myogenic ptosis of left eyelid: Secondary | ICD-10-CM | POA: Diagnosis not present

## 2022-02-04 ENCOUNTER — Encounter: Payer: Self-pay | Admitting: Radiation Oncology

## 2022-02-04 NOTE — Progress Notes (Signed)
Radiation Oncology         (336) [phone] ________________________________  Outpatient Follow Up - Conducted via telephone at patient request.  I spoke with the patient to conduct this consult visit via telephone. The patient was notified in advance and was offered an in person or telemedicine meeting to allow for face to face communication but instead preferred to proceed with a telephone visit.  Name: Diane Obrien        MRN: [id number]  Date of Service: 02/05/2022 DOB: [birth year]  XT:GGYIRS, Shanon Brow, MD  Benay Pike, MD     REFERRING PHYSICIAN: Benay Pike, MD   DIAGNOSIS: The encounter diagnosis was Ductal carcinoma in situ (DCIS) of right breast.   HISTORY OF PRESENT ILLNESS: Diane Obrien is a 71 y.o. female seen at the request of Dr. Marlou Starks for a new diagnosis of right breast cancer. The patient was noted to have screening calcificiations on mammogram and these measured up to 1.6 cm in the upper outer quadrant of the right breast. She underwent stereotactic biopsy on 10/11/21 that showed low grade DCIS that was ER/PR positive.   Since her last visit she underwent right lumpectomy on 12/07/21. This showed a 2.3 cm area of intermediate grade DCIS with lobular features no evidence of invasive disease was noted but it did involve the inferior margin.  She underwent reexcision of her margin on 01/05/2019 again an additional area of intermediate grade DCIS measuring 1 cm was noted and DCIS was focally 1 mm from the inferior margin.  She is contacted by phone today to discuss treatment recommendations in the adjuvant setting.   PREVIOUS RADIATION THERAPY: No   PAST MEDICAL HISTORY:  Past Medical History:  Diagnosis Date   Arthritis    knees   Breast cancer ([place]) 10/11/2021   GERD (gastroesophageal reflux disease)    Hyperlipidemia    Melanoma ([place]) 06/10/2010   nose   PONV (postoperative nausea and vomiting)    Vertigo        PAST SURGICAL HISTORY: Past Surgical History:   Procedure Laterality Date   ANAL FISSURE REPAIR      x2   AUGMENTATION MAMMAPLASTY Bilateral    removed 2021   BREAST LUMPECTOMY WITH RADIOACTIVE SEED LOCALIZATION Right 12/07/2021   Procedure: RIGHT BREAST LUMPECTOMY WITH RADIOACTIVE SEED LOCALIZATION;  Surgeon: Jovita Kussmaul, MD;  Location: [facility];  Service: General;  Laterality: Right;   [place]     RE-EXCISION OF BREAST LUMPECTOMY Right 01/04/2022   Procedure: RE-EXCISION RIGHT BREAST INFERIOR MARGINS;  Surgeon: Jovita Kussmaul, MD;  Location: [place];  Service: General;  Laterality: Right;     FAMILY HISTORY:  Family History  Problem Relation Age of Onset   Breast cancer Mother        age 35 or 70   Ovarian cancer Maternal Grandmother    Colon cancer Neg Hx    Stomach cancer Neg Hx      SOCIAL HISTORY:  reports that she quit smoking about 38 years ago. Her smoking use included cigarettes. She does not have any smokeless tobacco history on file. She reports that she does not drink alcohol and does not use drugs. The patient is married and lives in [place]. 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    ALLERGIES: Other, Pravastatin sodium, and  Sulfa antibiotics   MEDICATIONS:  Current Outpatient Medications  Medication Sig Dispense Refill   meclizine (ANTIVERT) 25 MG tablet Take by mouth.     Multiple Vitamins-Minerals (MULTIVITAMIN WITH MINERALS) tablet Take 1 tablet by mouth daily.     oxyCODONE (ROXICODONE) 5 MG immediate release tablet Take 1 tablet (5 mg total) by mouth every 6 (six) hours as needed for severe pain. 10 tablet 0   oxyCODONE (ROXICODONE) 5 MG immediate release tablet Take 1 tablet (5 mg total) by mouth every 6 (six) hours as needed for severe pain. 10 tablet 0   No current facility-administered medications  for this visit.     REVIEW OF SYSTEMS: On review of systems, the patient reports that she is doing well with some mild discomfort in the right breast. No other complaints are verbalized.      PHYSICAL EXAM:  Unable to assess due to encounter type.   ECOG = 1  0 - Asymptomatic (Fully active, able to carry on all predisease activities without restriction)  1 - Symptomatic but completely ambulatory (Restricted in physically strenuous activity but ambulatory and able to carry out work of a light or sedentary nature. For example, light housework, office work)  2 - Symptomatic, <50% in bed during the day (Ambulatory and capable of all self care but unable to carry out any work activities. Up and about more than 50% of waking hours)  3 - Symptomatic, >50% in bed, but not bedbound (Capable of only limited self-care, confined to bed or chair 50% or more of waking hours)  4 - Bedbound (Completely disabled. Cannot carry on any self-care. Totally confined to bed or chair)  5 - Death   Eustace Pen MM, Creech RH, Tormey DC, et al. [phone]). "Toxicity and response criteria of the [facility]". [place] Oncol. 5 (6): 649-55    LABORATORY DATA:  Lab Results  Component Value Date   WBC 5.2 08/05/2008   HGB 13.5 08/05/2008   HCT 39.5 08/05/2008   MCV 90.6 08/05/2008   PLT 217 08/05/2008   Lab Results  Component Value Date   NA 141 08/05/2008   K 3.8 08/05/2008   CL 103 08/05/2008   CO2 29 08/05/2008   Lab Results  Component Value Date   ALT 23 08/05/2008   AST 24 08/05/2008   ALKPHOS 86 08/05/2008   BILITOT 0.8 08/05/2008      RADIOGRAPHY: No results found.     IMPRESSION/PLAN: 1. Intermediate grade, ER/PR positive DCIS of the right breast. Dr. Lisbeth Renshaw has reviewed her final pathology findings and today I reviewed the nature of early stage right breast disease.  The patient has done well with healing however after reviewing her margins, Dr. Lisbeth Renshaw would  recommend adjuvant external radiotherapy to the breast  to reduce risks of local recurrence followed by antiestrogen therapy.  We discussed the risks, benefits, short, and long term effects of radiotherapy, as well as the curative intent, and the patient is interested in revisiting her final decision after surgery.  I reviewed the delivery and logistics of radiotherapy and reviewed Dr. Ida Rogue recommendation for 4 weeks of radiotherapy to the right breast.   will simulate tomorrow at which time she will sign written consent to proceed.  This encounter was conducted via telephone.  The patient has provided two factor identification and has given verbal consent for this type of encounter and has been advised to only accept a meeting of this type in a secure network  environment. The time spent during this encounter was 35 minutes including preparation, discussion, and coordination of the patient's care. The attendants for this meeting included Diane Obrien, and her husband Azusena Erlandson.  During the encounter,  Hayden Pedro was located at [facility] Radiation Oncology Department.  Diane Obrien was located at home with her husband Tora Prunty.      Carola Rhine, [facility]    **Disclaimer: This note was dictated with voice recognition software. Similar sounding words can inadvertently be transcribed and this note may contain transcription errors which may not have been corrected upon publication of note.**

## 2022-02-04 NOTE — Progress Notes (Signed)
Follow-up-new telephone appointment. I verified patient's identity and began nursing interview. Patient reports occasional, mild pain 1/10 RT breast. No other issues reported at this time.  Meaningful use complete.  Reminded patient of her 9:30am-02/05/22 telephone appointment w/ Shona Simpson PA-C. I left my extension [phone] in case patient needs anything. Patient verbalized understanding.  Patient contact [phone]

## 2022-02-05 ENCOUNTER — Ambulatory Visit
Admission: RE | Admit: 2022-02-05 | Discharge: 2022-02-05 | Disposition: A | Discharge status: Home / Self Care | Payer: Federal, State, Local not specified - PPO | Source: Ambulatory Visit | Attending: Radiation Oncology | Admitting: Radiation Oncology

## 2022-02-05 DIAGNOSIS — D0511 Intraductal carcinoma in situ of right breast: Secondary | ICD-10-CM | POA: Diagnosis not present

## 2022-02-05 DIAGNOSIS — Z170 Estrogen receptor positive status [ER+]: Secondary | ICD-10-CM | POA: Diagnosis not present

## 2022-02-06 ENCOUNTER — Ambulatory Visit
Admission: RE | Admit: 2022-02-06 | Discharge: 2022-02-06 | Disposition: A | Discharge status: Home / Self Care | Payer: Medicare Other | Source: Ambulatory Visit | Attending: Radiation Oncology | Admitting: Radiation Oncology

## 2022-02-06 ENCOUNTER — Ambulatory Visit: Payer: Medicare Other | Admitting: Radiation Oncology

## 2022-02-06 DIAGNOSIS — Z510 Encounter for antineoplastic radiation therapy: Secondary | ICD-10-CM | POA: Insufficient documentation

## 2022-02-06 DIAGNOSIS — D0511 Intraductal carcinoma in situ of right breast: Secondary | ICD-10-CM | POA: Insufficient documentation

## 2022-02-06 DIAGNOSIS — Z170 Estrogen receptor positive status [ER+]: Secondary | ICD-10-CM | POA: Diagnosis not present

## 2022-02-12 ENCOUNTER — Encounter: Payer: Self-pay | Admitting: *Deleted

## 2022-02-13 DIAGNOSIS — Z170 Estrogen receptor positive status [ER+]: Secondary | ICD-10-CM | POA: Insufficient documentation

## 2022-02-13 DIAGNOSIS — Z85820 Personal history of malignant melanoma of skin: Secondary | ICD-10-CM | POA: Diagnosis not present

## 2022-02-13 DIAGNOSIS — Z800 Family history of malignant neoplasm of digestive organs: Secondary | ICD-10-CM | POA: Diagnosis not present

## 2022-02-13 DIAGNOSIS — E785 Hyperlipidemia, unspecified: Secondary | ICD-10-CM | POA: Insufficient documentation

## 2022-02-13 DIAGNOSIS — Z803 Family history of malignant neoplasm of breast: Secondary | ICD-10-CM | POA: Insufficient documentation

## 2022-02-13 DIAGNOSIS — K219 Gastro-esophageal reflux disease without esophagitis: Secondary | ICD-10-CM | POA: Diagnosis not present

## 2022-02-13 DIAGNOSIS — D0511 Intraductal carcinoma in situ of right breast: Secondary | ICD-10-CM | POA: Insufficient documentation

## 2022-02-13 DIAGNOSIS — Z87891 Personal history of nicotine dependence: Secondary | ICD-10-CM | POA: Insufficient documentation

## 2022-02-13 DIAGNOSIS — Z510 Encounter for antineoplastic radiation therapy: Secondary | ICD-10-CM | POA: Insufficient documentation

## 2022-02-18 ENCOUNTER — Ambulatory Visit
Admission: RE | Admit: 2022-02-18 | Discharge: 2022-02-18 | Disposition: A | Discharge status: Home / Self Care | Payer: Medicare Other | Source: Ambulatory Visit | Attending: Radiation Oncology | Admitting: Radiation Oncology

## 2022-02-18 ENCOUNTER — Other Ambulatory Visit: Payer: Self-pay

## 2022-02-18 DIAGNOSIS — Z85820 Personal history of malignant melanoma of skin: Secondary | ICD-10-CM | POA: Diagnosis not present

## 2022-02-18 DIAGNOSIS — E785 Hyperlipidemia, unspecified: Secondary | ICD-10-CM | POA: Diagnosis not present

## 2022-02-18 DIAGNOSIS — Z170 Estrogen receptor positive status [ER+]: Secondary | ICD-10-CM | POA: Diagnosis not present

## 2022-02-18 DIAGNOSIS — D0511 Intraductal carcinoma in situ of right breast: Secondary | ICD-10-CM | POA: Diagnosis not present

## 2022-02-18 DIAGNOSIS — Z510 Encounter for antineoplastic radiation therapy: Secondary | ICD-10-CM | POA: Diagnosis not present

## 2022-02-18 DIAGNOSIS — K219 Gastro-esophageal reflux disease without esophagitis: Secondary | ICD-10-CM | POA: Diagnosis not present

## 2022-02-18 LAB — RAD ONC ARIA SESSION SUMMARY
Course Elapsed Days: 0
Plan Fractions Treated to Date: 1
Plan Prescribed Dose Per Fraction: 2.66 Gy
Plan Total Fractions Prescribed: 16
Plan Total Prescribed Dose: 42.56 Gy
Reference Point Dosage Given to Date: 2.66 Gy
Reference Point Session Dosage Given: 2.66 Gy
Session Number: 1

## 2022-02-19 ENCOUNTER — Other Ambulatory Visit: Payer: Self-pay

## 2022-02-19 ENCOUNTER — Ambulatory Visit
Admission: RE | Admit: 2022-02-19 | Discharge: 2022-02-19 | Disposition: A | Discharge status: Home / Self Care | Payer: Medicare Other | Source: Ambulatory Visit | Attending: Radiation Oncology | Admitting: Radiation Oncology

## 2022-02-19 DIAGNOSIS — Z510 Encounter for antineoplastic radiation therapy: Secondary | ICD-10-CM | POA: Diagnosis not present

## 2022-02-19 DIAGNOSIS — Z170 Estrogen receptor positive status [ER+]: Secondary | ICD-10-CM | POA: Diagnosis not present

## 2022-02-19 DIAGNOSIS — D0511 Intraductal carcinoma in situ of right breast: Secondary | ICD-10-CM | POA: Diagnosis not present

## 2022-02-19 DIAGNOSIS — E785 Hyperlipidemia, unspecified: Secondary | ICD-10-CM | POA: Diagnosis not present

## 2022-02-19 DIAGNOSIS — K219 Gastro-esophageal reflux disease without esophagitis: Secondary | ICD-10-CM | POA: Diagnosis not present

## 2022-02-19 DIAGNOSIS — Z85820 Personal history of malignant melanoma of skin: Secondary | ICD-10-CM | POA: Diagnosis not present

## 2022-02-19 LAB — RAD ONC ARIA SESSION SUMMARY
Course Elapsed Days: 1
Plan Fractions Treated to Date: 2
Plan Prescribed Dose Per Fraction: 2.66 Gy
Plan Total Fractions Prescribed: 16
Plan Total Prescribed Dose: 42.56 Gy
Reference Point Dosage Given to Date: 5.32 Gy
Reference Point Session Dosage Given: 2.66 Gy
Session Number: 2

## 2022-02-20 ENCOUNTER — Ambulatory Visit
Admission: RE | Admit: 2022-02-20 | Discharge: 2022-02-20 | Disposition: A | Discharge status: Home / Self Care | Payer: Medicare Other | Source: Ambulatory Visit | Attending: Radiation Oncology | Admitting: Radiation Oncology

## 2022-02-20 ENCOUNTER — Other Ambulatory Visit: Payer: Self-pay

## 2022-02-20 DIAGNOSIS — D0511 Intraductal carcinoma in situ of right breast: Secondary | ICD-10-CM | POA: Diagnosis not present

## 2022-02-20 DIAGNOSIS — Z510 Encounter for antineoplastic radiation therapy: Secondary | ICD-10-CM | POA: Diagnosis not present

## 2022-02-20 DIAGNOSIS — K219 Gastro-esophageal reflux disease without esophagitis: Secondary | ICD-10-CM | POA: Diagnosis not present

## 2022-02-20 DIAGNOSIS — E785 Hyperlipidemia, unspecified: Secondary | ICD-10-CM | POA: Diagnosis not present

## 2022-02-20 DIAGNOSIS — Z85820 Personal history of malignant melanoma of skin: Secondary | ICD-10-CM | POA: Diagnosis not present

## 2022-02-20 DIAGNOSIS — Z170 Estrogen receptor positive status [ER+]: Secondary | ICD-10-CM | POA: Diagnosis not present

## 2022-02-20 LAB — RAD ONC ARIA SESSION SUMMARY
Course Elapsed Days: 2
Plan Fractions Treated to Date: 3
Plan Prescribed Dose Per Fraction: 2.66 Gy
Plan Total Fractions Prescribed: 16
Plan Total Prescribed Dose: 42.56 Gy
Reference Point Dosage Given to Date: 7.98 Gy
Reference Point Session Dosage Given: 2.66 Gy
Session Number: 3

## 2022-02-21 ENCOUNTER — Other Ambulatory Visit: Payer: Self-pay

## 2022-02-21 ENCOUNTER — Ambulatory Visit
Admission: RE | Admit: 2022-02-21 | Discharge: 2022-02-21 | Disposition: A | Discharge status: Home / Self Care | Payer: Medicare Other | Source: Ambulatory Visit | Attending: Radiation Oncology | Admitting: Radiation Oncology

## 2022-02-21 DIAGNOSIS — Z85820 Personal history of malignant melanoma of skin: Secondary | ICD-10-CM | POA: Diagnosis not present

## 2022-02-21 DIAGNOSIS — K219 Gastro-esophageal reflux disease without esophagitis: Secondary | ICD-10-CM | POA: Diagnosis not present

## 2022-02-21 DIAGNOSIS — Z170 Estrogen receptor positive status [ER+]: Secondary | ICD-10-CM | POA: Diagnosis not present

## 2022-02-21 DIAGNOSIS — D0511 Intraductal carcinoma in situ of right breast: Secondary | ICD-10-CM | POA: Diagnosis not present

## 2022-02-21 DIAGNOSIS — E785 Hyperlipidemia, unspecified: Secondary | ICD-10-CM | POA: Diagnosis not present

## 2022-02-21 DIAGNOSIS — Z510 Encounter for antineoplastic radiation therapy: Secondary | ICD-10-CM | POA: Diagnosis not present

## 2022-02-21 LAB — RAD ONC ARIA SESSION SUMMARY
Course Elapsed Days: 3
Plan Fractions Treated to Date: 4
Plan Prescribed Dose Per Fraction: 2.66 Gy
Plan Total Fractions Prescribed: 16
Plan Total Prescribed Dose: 42.56 Gy
Reference Point Dosage Given to Date: 10.64 Gy
Reference Point Session Dosage Given: 2.66 Gy
Session Number: 4

## 2022-02-21 NOTE — Progress Notes (Signed)
Pt here for patient teaching.  Pt given Radiation and You booklet, skin care instructions, alra deodorant and Radiaplex.    Reviewed areas of pertinence such as fatigue, hair loss, skin changes, breast tenderness, and breast swelling. Pt able to give teach back of to pat skin and use unscented/gentle soap,apply Radiaplex bid, avoid applying anything to skin within 4 hours of treatment, avoid wearing an under wire bra, and to use an electric razor if they must shave. Pt verbalizes understanding of information given and will contact nursing with any questions or concerns.    Maahi Lannan M. Niurka Benecke RN, BSN  

## 2022-02-22 ENCOUNTER — Ambulatory Visit
Admission: RE | Admit: 2022-02-22 | Discharge: 2022-02-22 | Disposition: A | Discharge status: Home / Self Care | Payer: Medicare Other | Source: Ambulatory Visit | Attending: Radiation Oncology | Admitting: Radiation Oncology

## 2022-02-22 ENCOUNTER — Other Ambulatory Visit: Payer: Self-pay

## 2022-02-22 DIAGNOSIS — D0511 Intraductal carcinoma in situ of right breast: Secondary | ICD-10-CM

## 2022-02-22 DIAGNOSIS — Z85820 Personal history of malignant melanoma of skin: Secondary | ICD-10-CM | POA: Diagnosis not present

## 2022-02-22 DIAGNOSIS — E785 Hyperlipidemia, unspecified: Secondary | ICD-10-CM | POA: Diagnosis not present

## 2022-02-22 DIAGNOSIS — Z170 Estrogen receptor positive status [ER+]: Secondary | ICD-10-CM | POA: Diagnosis not present

## 2022-02-22 DIAGNOSIS — K219 Gastro-esophageal reflux disease without esophagitis: Secondary | ICD-10-CM | POA: Diagnosis not present

## 2022-02-22 DIAGNOSIS — Z510 Encounter for antineoplastic radiation therapy: Secondary | ICD-10-CM | POA: Diagnosis not present

## 2022-02-22 LAB — RAD ONC ARIA SESSION SUMMARY
Course Elapsed Days: 4
Plan Fractions Treated to Date: 5
Plan Prescribed Dose Per Fraction: 2.66 Gy
Plan Total Fractions Prescribed: 16
Plan Total Prescribed Dose: 42.56 Gy
Reference Point Dosage Given to Date: 13.3 Gy
Reference Point Session Dosage Given: 2.66 Gy
Session Number: 5

## 2022-02-22 MED ORDER — RADIAPLEXRX EX GEL: Freq: Once | CUTANEOUS | Status: AC

## 2022-02-25 ENCOUNTER — Other Ambulatory Visit: Payer: Self-pay

## 2022-02-25 ENCOUNTER — Ambulatory Visit
Admission: RE | Admit: 2022-02-25 | Discharge: 2022-02-25 | Disposition: A | Discharge status: Home / Self Care | Payer: Medicare Other | Source: Ambulatory Visit | Attending: Radiation Oncology | Admitting: Radiation Oncology

## 2022-02-25 DIAGNOSIS — E785 Hyperlipidemia, unspecified: Secondary | ICD-10-CM | POA: Diagnosis not present

## 2022-02-25 DIAGNOSIS — Z510 Encounter for antineoplastic radiation therapy: Secondary | ICD-10-CM | POA: Diagnosis not present

## 2022-02-25 DIAGNOSIS — Z170 Estrogen receptor positive status [ER+]: Secondary | ICD-10-CM | POA: Diagnosis not present

## 2022-02-25 DIAGNOSIS — Z85820 Personal history of malignant melanoma of skin: Secondary | ICD-10-CM | POA: Diagnosis not present

## 2022-02-25 DIAGNOSIS — D0511 Intraductal carcinoma in situ of right breast: Secondary | ICD-10-CM | POA: Diagnosis not present

## 2022-02-25 DIAGNOSIS — K219 Gastro-esophageal reflux disease without esophagitis: Secondary | ICD-10-CM | POA: Diagnosis not present

## 2022-02-25 LAB — RAD ONC ARIA SESSION SUMMARY
Course Elapsed Days: 7
Plan Fractions Treated to Date: 6
Plan Prescribed Dose Per Fraction: 2.66 Gy
Plan Total Fractions Prescribed: 16
Plan Total Prescribed Dose: 42.56 Gy
Reference Point Dosage Given to Date: 15.96 Gy
Reference Point Session Dosage Given: 2.66 Gy
Session Number: 6

## 2022-02-26 ENCOUNTER — Other Ambulatory Visit: Payer: Self-pay

## 2022-02-26 ENCOUNTER — Ambulatory Visit
Admission: RE | Admit: 2022-02-26 | Discharge: 2022-02-26 | Disposition: A | Discharge status: Home / Self Care | Payer: Medicare Other | Source: Ambulatory Visit | Attending: Radiation Oncology | Admitting: Radiation Oncology

## 2022-02-26 DIAGNOSIS — E785 Hyperlipidemia, unspecified: Secondary | ICD-10-CM | POA: Diagnosis not present

## 2022-02-26 DIAGNOSIS — Z85820 Personal history of malignant melanoma of skin: Secondary | ICD-10-CM | POA: Diagnosis not present

## 2022-02-26 DIAGNOSIS — Z510 Encounter for antineoplastic radiation therapy: Secondary | ICD-10-CM | POA: Diagnosis not present

## 2022-02-26 DIAGNOSIS — K219 Gastro-esophageal reflux disease without esophagitis: Secondary | ICD-10-CM | POA: Diagnosis not present

## 2022-02-26 DIAGNOSIS — D0511 Intraductal carcinoma in situ of right breast: Secondary | ICD-10-CM | POA: Diagnosis not present

## 2022-02-26 DIAGNOSIS — Z170 Estrogen receptor positive status [ER+]: Secondary | ICD-10-CM | POA: Diagnosis not present

## 2022-02-26 LAB — RAD ONC ARIA SESSION SUMMARY
Course Elapsed Days: 8
Plan Fractions Treated to Date: 7
Plan Prescribed Dose Per Fraction: 2.66 Gy
Plan Total Fractions Prescribed: 16
Plan Total Prescribed Dose: 42.56 Gy
Reference Point Dosage Given to Date: 18.62 Gy
Reference Point Session Dosage Given: 2.66 Gy
Session Number: 7

## 2022-02-27 ENCOUNTER — Ambulatory Visit: Payer: Medicare Other

## 2022-02-28 ENCOUNTER — Other Ambulatory Visit: Payer: Self-pay

## 2022-02-28 ENCOUNTER — Ambulatory Visit
Admission: RE | Admit: 2022-02-28 | Discharge: 2022-02-28 | Disposition: A | Discharge status: Home / Self Care | Payer: Medicare Other | Source: Ambulatory Visit | Attending: Radiation Oncology | Admitting: Radiation Oncology

## 2022-02-28 DIAGNOSIS — K219 Gastro-esophageal reflux disease without esophagitis: Secondary | ICD-10-CM | POA: Diagnosis not present

## 2022-02-28 DIAGNOSIS — E785 Hyperlipidemia, unspecified: Secondary | ICD-10-CM | POA: Diagnosis not present

## 2022-02-28 DIAGNOSIS — Z85820 Personal history of malignant melanoma of skin: Secondary | ICD-10-CM | POA: Diagnosis not present

## 2022-02-28 DIAGNOSIS — D0511 Intraductal carcinoma in situ of right breast: Secondary | ICD-10-CM | POA: Diagnosis not present

## 2022-02-28 DIAGNOSIS — Z170 Estrogen receptor positive status [ER+]: Secondary | ICD-10-CM | POA: Diagnosis not present

## 2022-02-28 DIAGNOSIS — Z510 Encounter for antineoplastic radiation therapy: Secondary | ICD-10-CM | POA: Diagnosis not present

## 2022-02-28 LAB — RAD ONC ARIA SESSION SUMMARY
Course Elapsed Days: 10
Plan Fractions Treated to Date: 8
Plan Prescribed Dose Per Fraction: 2.66 Gy
Plan Total Fractions Prescribed: 16
Plan Total Prescribed Dose: 42.56 Gy
Reference Point Dosage Given to Date: 21.28 Gy
Reference Point Session Dosage Given: 2.66 Gy
Session Number: 8

## 2022-03-01 ENCOUNTER — Inpatient Hospital Stay: Payer: Medicare Other | Attending: Hematology and Oncology | Admitting: Hematology and Oncology

## 2022-03-01 ENCOUNTER — Encounter: Payer: Self-pay | Admitting: Hematology and Oncology

## 2022-03-01 ENCOUNTER — Other Ambulatory Visit: Payer: Self-pay

## 2022-03-01 ENCOUNTER — Ambulatory Visit
Admission: RE | Admit: 2022-03-01 | Discharge: 2022-03-01 | Disposition: A | Discharge status: Home / Self Care | Payer: Medicare Other | Source: Ambulatory Visit | Attending: Radiation Oncology | Admitting: Radiation Oncology

## 2022-03-01 DIAGNOSIS — E785 Hyperlipidemia, unspecified: Secondary | ICD-10-CM | POA: Diagnosis not present

## 2022-03-01 DIAGNOSIS — Z170 Estrogen receptor positive status [ER+]: Secondary | ICD-10-CM | POA: Diagnosis not present

## 2022-03-01 DIAGNOSIS — Z510 Encounter for antineoplastic radiation therapy: Secondary | ICD-10-CM | POA: Diagnosis not present

## 2022-03-01 DIAGNOSIS — D0511 Intraductal carcinoma in situ of right breast: Secondary | ICD-10-CM | POA: Diagnosis not present

## 2022-03-01 DIAGNOSIS — Z85820 Personal history of malignant melanoma of skin: Secondary | ICD-10-CM | POA: Diagnosis not present

## 2022-03-01 DIAGNOSIS — K219 Gastro-esophageal reflux disease without esophagitis: Secondary | ICD-10-CM | POA: Diagnosis not present

## 2022-03-01 LAB — RAD ONC ARIA SESSION SUMMARY
Course Elapsed Days: 11
Plan Fractions Treated to Date: 9
Plan Prescribed Dose Per Fraction: 2.66 Gy
Plan Total Fractions Prescribed: 16
Plan Total Prescribed Dose: 42.56 Gy
Reference Point Dosage Given to Date: 23.94 Gy
Reference Point Session Dosage Given: 2.66 Gy
Session Number: 9

## 2022-03-01 MED ORDER — TAMOXIFEN CITRATE 10 MG PO TABS: 5.0000 mg | ORAL_TABLET | Freq: Every day | ORAL | 3 refills | Status: DC

## 2022-03-01 NOTE — Progress Notes (Signed)
[facility] CONSULT NOTE  Patient Care Team: Antony Contras, MD as PCP - General (Family Medicine) Mauro Kaufmann, RN as Oncology Nurse Navigator Rockwell Germany, RN as Oncology Nurse Navigator Benay Pike, MD as Consulting Physician (Hematology and Oncology)  CHIEF COMPLAINTS/PURPOSE OF CONSULTATION:  Newly diagnosed breast cancer  HISTORY OF PRESENTING ILLNESS:  Diane Obrien 71 y.o. female is here because of recent diagnosis of right breast DCIS  I reviewed her records extensively and collaborated the history with the patient.  SUMMARY OF ONCOLOGIC HISTORY: Oncology History  DCIS (ductal carcinoma in situ) of breast  10/03/2021 Mammogram   Mammogram showed suspicious calcifications within the upper outer quadrant of the right breast for which stereotactic biopsy is recommended.    10/11/2021 Pathology Results   Biopsy from the right breast needle core biopsy showed DCIS with solid growth pattern, microcalcification and comedonecrosis, linear extent of DCIS 8 mm, nuclear grade 1, ER 100% positive strong staining PR 15% positive strong to moderate staining intensity.   11/29/2021 Initial Diagnosis   DCIS (ductal carcinoma in situ) of breast   01/04/2022 Pathology Results   Right breast lumpectomy showed DCIS, intermediate grade, 2.2 cm with lobular cancerization, no evidence of invasive carcinoma.  Repeat margin reexcision showed DCIS, intermediate grade, 1 cm, focally 0.1 cm from inferior margin.  Changes consistent with previous lumpectomy    Patient is here for follow-up since her surgery.  She is now undergoing adjuvant radiation, has about 2 weeks more.  She has overall healing very well from surgery.  She has done her review of antiestrogen therapy and is hoping to start on tamoxifen.  She is very worried about the impact on cholesterol with anastrozole since she has borderline hypercholesterolemia.  Rest of the pertinent 10 point ROS reviewed and  negative  MEDICAL HISTORY:  Past Medical History:  Diagnosis Date   Arthritis    knees   Breast cancer ([place]) 10/11/2021   GERD (gastroesophageal reflux disease)    Hyperlipidemia    Melanoma ([facility]) 06/10/2010   nose   PONV (postoperative nausea and vomiting)    Vertigo     SURGICAL HISTORY: Past Surgical History:  Procedure Laterality Date   ANAL FISSURE REPAIR      x2   AUGMENTATION MAMMAPLASTY Bilateral    removed 2021   BREAST LUMPECTOMY WITH RADIOACTIVE SEED LOCALIZATION Right 12/07/2021   Procedure: RIGHT BREAST LUMPECTOMY WITH RADIOACTIVE SEED LOCALIZATION;  Surgeon: Jovita Kussmaul, MD;  Location: [place] [place];  Service: General;  Laterality: Right;   [place]     RE-EXCISION OF BREAST LUMPECTOMY Right 01/04/2022   Procedure: RE-EXCISION RIGHT BREAST INFERIOR MARGINS;  Surgeon: Jovita Kussmaul, MD;  Location: [place];  Service: General;  Laterality: Right;    SOCIAL HISTORY: Social History   Socioeconomic History   Marital status: Married    Spouse name: Not on file   Number of children: Not on file   Years of education: Not on file   Highest education level: Not on file  Occupational History   Not on file  Tobacco Use   Smoking status: Former    Types: Cigarettes    Quit date: 05/15/1983    Years since quitting: 38.8   Smokeless tobacco: Not on file  Substance and Sexual Activity   Alcohol use: No   Drug use: No   Sexual activity: Not on file  Other Topics  Concern   Not on file  Social History Narrative   Not on file   Social Determinants of Health   Financial Resource Strain: Not on file  Food Insecurity: Not on file  Transportation Needs: Not on file  Physical Activity: Not on file  Stress: Not on file  Social Connections: Not on file  Intimate Partner Violence: Not on file    FAMILY HISTORY: Family History  Problem Relation Age of Onset   Breast  cancer Mother        age 46 or 62   Ovarian cancer Maternal Grandmother    Colon cancer Neg Hx    Stomach cancer Neg Hx     ALLERGIES:  is allergic to other, pravastatin sodium, and sulfa antibiotics.  MEDICATIONS:  Current Outpatient Medications  Medication Sig Dispense Refill   tamoxifen (NOLVADEX) 10 MG tablet Take 0.5 tablets (5 mg total) by mouth daily. 90 tablet 3   meclizine (ANTIVERT) 25 MG tablet Take by mouth.     Multiple Vitamins-Minerals (MULTIVITAMIN WITH MINERALS) tablet Take 1 tablet by mouth daily.     oxyCODONE (ROXICODONE) 5 MG immediate release tablet Take 1 tablet (5 mg total) by mouth every 6 (six) hours as needed for severe pain. 10 tablet 0   oxyCODONE (ROXICODONE) 5 MG immediate release tablet Take 1 tablet (5 mg total) by mouth every 6 (six) hours as needed for severe pain. 10 tablet 0   No current facility-administered medications for this visit.    REVIEW OF SYSTEMS:   Constitutional: Denies fevers, chills or abnormal night sweats Eyes: Denies blurriness of vision, double vision or watery eyes Ears, nose, mouth, throat, and face: Denies mucositis or sore throat Respiratory: Denies cough, dyspnea or wheezes Cardiovascular: Denies palpitation, chest discomfort or lower extremity swelling Gastrointestinal:  Denies nausea, heartburn or change in bowel habits Skin: Denies abnormal skin rashes Lymphatics: Denies new lymphadenopathy or easy bruising Neurological:Denies numbness, tingling or new weaknesses Behavioral/Psych: Mood is stable, no new changes  Breast: Denies any palpable lumps or discharge All other systems were reviewed with the patient and are negative.  PHYSICAL EXAMINATION: ECOG PERFORMANCE STATUS: 0 - Asymptomatic  Vitals:   03/01/22 1152  BP: (!) 164/77  Pulse: 87  Resp: 16  Temp: 97.9 F (36.6 C)  SpO2: 100%   Filed Weights   03/01/22 1152  Weight: 148 lb 4.8 oz (67.3 kg)    Physical exam deferred today in lieu of  counseling  LABORATORY DATA:  I have reviewed the data as listed Lab Results  Component Value Date   WBC 5.2 08/05/2008   HGB 13.5 08/05/2008   HCT 39.5 08/05/2008   MCV 90.6 08/05/2008   PLT 217 08/05/2008   Lab Results  Component Value Date   NA 141 08/05/2008   K 3.8 08/05/2008   CL 103 08/05/2008   CO2 29 08/05/2008    RADIOGRAPHIC STUDIES: I have personally reviewed the radiological reports and agreed with the findings in the report.  ASSESSMENT AND PLAN:  DCIS (ductal carcinoma in situ) of breast This is a very pleasant 71 year old postmenopausal female patient newly diagnosed with right breast DCIS on screening mammogram, prognostics ER 100% positive strong staining, PR 15% positive strong to moderate staining referred to medical oncology for recommendations. We have discussed once again about antiestrogen therapy today.  She is leaning towards tamoxifen after reviewing the material.  We have discussed the risk of DVT/PE as well as endometrial thickening and endometrial cancer. She is hoping  to do the low-dose tamoxifen given some data and DCIS.  I have prescribed tamoxifen 5 mg daily.  She will return to clinic in about 3 to 4 months with survivorship clinic in 7 months with MD.  Thank you for consulting [name] the care of this patient.  Please not hesitate to contact us with any additional questions or concerns    All questions were answered. The patient knows to call the clinic with any problems, questions or concerns.    Benay Pike, MD 03/01/22

## 2022-03-01 NOTE — Assessment & Plan Note (Signed)
This is a very pleasant 71 year old postmenopausal female patient newly diagnosed with right breast DCIS on screening mammogram, prognostics ER 100% positive strong staining, PR 15% positive strong to moderate staining referred to medical oncology for recommendations. We have discussed once again about antiestrogen therapy today.  She is leaning towards tamoxifen after reviewing the material.  We have discussed the risk of DVT/PE as well as endometrial thickening and endometrial cancer. She is hoping to do the low-dose tamoxifen given some data and DCIS.  I have prescribed tamoxifen 5 mg daily.  She will return to clinic in about 3 to 4 months with survivorship clinic in 7 months with MD.  Thank you for consulting [name] the care of this patient.  Please not hesitate to contact us with any additional questions or concerns

## 2022-03-04 ENCOUNTER — Other Ambulatory Visit: Payer: Self-pay

## 2022-03-04 ENCOUNTER — Ambulatory Visit
Admission: RE | Admit: 2022-03-04 | Discharge: 2022-03-04 | Disposition: A | Discharge status: Home / Self Care | Payer: Medicare Other | Source: Ambulatory Visit | Attending: Radiation Oncology | Admitting: Radiation Oncology

## 2022-03-04 DIAGNOSIS — Z170 Estrogen receptor positive status [ER+]: Secondary | ICD-10-CM | POA: Diagnosis not present

## 2022-03-04 DIAGNOSIS — Z510 Encounter for antineoplastic radiation therapy: Secondary | ICD-10-CM | POA: Diagnosis not present

## 2022-03-04 DIAGNOSIS — E785 Hyperlipidemia, unspecified: Secondary | ICD-10-CM | POA: Diagnosis not present

## 2022-03-04 DIAGNOSIS — K219 Gastro-esophageal reflux disease without esophagitis: Secondary | ICD-10-CM | POA: Diagnosis not present

## 2022-03-04 DIAGNOSIS — Z85820 Personal history of malignant melanoma of skin: Secondary | ICD-10-CM | POA: Diagnosis not present

## 2022-03-04 DIAGNOSIS — D0511 Intraductal carcinoma in situ of right breast: Secondary | ICD-10-CM | POA: Diagnosis not present

## 2022-03-04 LAB — RAD ONC ARIA SESSION SUMMARY
Course Elapsed Days: 14
Plan Fractions Treated to Date: 10
Plan Prescribed Dose Per Fraction: 2.66 Gy
Plan Total Fractions Prescribed: 16
Plan Total Prescribed Dose: 42.56 Gy
Reference Point Dosage Given to Date: 26.6 Gy
Reference Point Session Dosage Given: 2.66 Gy
Session Number: 10

## 2022-03-05 ENCOUNTER — Other Ambulatory Visit: Payer: Self-pay

## 2022-03-05 ENCOUNTER — Ambulatory Visit
Admission: RE | Admit: 2022-03-05 | Discharge: 2022-03-05 | Disposition: A | Discharge status: Home / Self Care | Payer: Medicare Other | Source: Ambulatory Visit | Attending: Radiation Oncology | Admitting: Radiation Oncology

## 2022-03-05 DIAGNOSIS — D0511 Intraductal carcinoma in situ of right breast: Secondary | ICD-10-CM | POA: Diagnosis not present

## 2022-03-05 DIAGNOSIS — K219 Gastro-esophageal reflux disease without esophagitis: Secondary | ICD-10-CM | POA: Diagnosis not present

## 2022-03-05 DIAGNOSIS — Z85820 Personal history of malignant melanoma of skin: Secondary | ICD-10-CM | POA: Diagnosis not present

## 2022-03-05 DIAGNOSIS — Z510 Encounter for antineoplastic radiation therapy: Secondary | ICD-10-CM | POA: Diagnosis not present

## 2022-03-05 DIAGNOSIS — Z170 Estrogen receptor positive status [ER+]: Secondary | ICD-10-CM | POA: Diagnosis not present

## 2022-03-05 DIAGNOSIS — E785 Hyperlipidemia, unspecified: Secondary | ICD-10-CM | POA: Diagnosis not present

## 2022-03-05 LAB — RAD ONC ARIA SESSION SUMMARY
Course Elapsed Days: 15
Plan Fractions Treated to Date: 11
Plan Prescribed Dose Per Fraction: 2.66 Gy
Plan Total Fractions Prescribed: 16
Plan Total Prescribed Dose: 42.56 Gy
Reference Point Dosage Given to Date: 29.26 Gy
Reference Point Session Dosage Given: 2.66 Gy
Session Number: 11

## 2022-03-06 ENCOUNTER — Other Ambulatory Visit: Payer: Self-pay

## 2022-03-06 ENCOUNTER — Encounter: Payer: Self-pay | Admitting: Radiation Oncology

## 2022-03-06 ENCOUNTER — Ambulatory Visit
Admission: RE | Admit: 2022-03-06 | Discharge: 2022-03-06 | Disposition: A | Discharge status: Home / Self Care | Payer: Medicare Other | Source: Ambulatory Visit | Attending: Radiation Oncology | Admitting: Radiation Oncology

## 2022-03-06 DIAGNOSIS — D0511 Intraductal carcinoma in situ of right breast: Secondary | ICD-10-CM | POA: Diagnosis not present

## 2022-03-06 DIAGNOSIS — K219 Gastro-esophageal reflux disease without esophagitis: Secondary | ICD-10-CM | POA: Diagnosis not present

## 2022-03-06 DIAGNOSIS — Z85820 Personal history of malignant melanoma of skin: Secondary | ICD-10-CM | POA: Diagnosis not present

## 2022-03-06 DIAGNOSIS — Z510 Encounter for antineoplastic radiation therapy: Secondary | ICD-10-CM | POA: Diagnosis not present

## 2022-03-06 DIAGNOSIS — E785 Hyperlipidemia, unspecified: Secondary | ICD-10-CM | POA: Diagnosis not present

## 2022-03-06 DIAGNOSIS — Z170 Estrogen receptor positive status [ER+]: Secondary | ICD-10-CM | POA: Diagnosis not present

## 2022-03-06 LAB — RAD ONC ARIA SESSION SUMMARY
Course Elapsed Days: 16
Plan Fractions Treated to Date: 12
Plan Prescribed Dose Per Fraction: 2.66 Gy
Plan Total Fractions Prescribed: 16
Plan Total Prescribed Dose: 42.56 Gy
Reference Point Dosage Given to Date: 31.92 Gy
Reference Point Session Dosage Given: 2.66 Gy
Session Number: 12

## 2022-03-06 NOTE — Progress Notes (Signed)
  Radiation Oncology         [phone] ________________________________  Name: Diane Obrien MRN: [id number]  Date: 03/06/2022  DOB: 1950-06-18  SIMULATION NOTE   NARRATIVE:  The patient underwent simulation today for ongoing radiation therapy.  The existing CT study set was employed for the purpose of virtual treatment planning.  The target and avoidance structures were reviewed and modified as necessary.  Treatment planning then occurred.  The radiation boost prescription was entered and confirmed.  A total of 1 complex treatment devices were fabricated in the form of an en face electron block/ field to shape radiation around the targets while maximally excluding nearby normal structures. I have requested : Isodose Plan.    PLAN:  This modified radiation beam arrangement is intended to continue the current radiation dose to an additional 8 Gy in 4 fractions for a total cumulative dose of 50.56 Gy.    ------------------------------------------------  Jodelle Gross, MD, PhD

## 2022-03-07 ENCOUNTER — Ambulatory Visit
Admission: RE | Admit: 2022-03-07 | Discharge: 2022-03-07 | Disposition: A | Discharge status: Home / Self Care | Payer: Medicare Other | Source: Ambulatory Visit | Attending: Radiation Oncology | Admitting: Radiation Oncology

## 2022-03-07 ENCOUNTER — Other Ambulatory Visit: Payer: Self-pay

## 2022-03-07 DIAGNOSIS — E785 Hyperlipidemia, unspecified: Secondary | ICD-10-CM | POA: Diagnosis not present

## 2022-03-07 DIAGNOSIS — K219 Gastro-esophageal reflux disease without esophagitis: Secondary | ICD-10-CM | POA: Diagnosis not present

## 2022-03-07 DIAGNOSIS — D0511 Intraductal carcinoma in situ of right breast: Secondary | ICD-10-CM | POA: Diagnosis not present

## 2022-03-07 DIAGNOSIS — Z170 Estrogen receptor positive status [ER+]: Secondary | ICD-10-CM | POA: Diagnosis not present

## 2022-03-07 DIAGNOSIS — Z510 Encounter for antineoplastic radiation therapy: Secondary | ICD-10-CM | POA: Diagnosis not present

## 2022-03-07 DIAGNOSIS — Z85820 Personal history of malignant melanoma of skin: Secondary | ICD-10-CM | POA: Diagnosis not present

## 2022-03-07 LAB — RAD ONC ARIA SESSION SUMMARY
Course Elapsed Days: 17
Plan Fractions Treated to Date: 13
Plan Prescribed Dose Per Fraction: 2.66 Gy
Plan Total Fractions Prescribed: 16
Plan Total Prescribed Dose: 42.56 Gy
Reference Point Dosage Given to Date: 34.58 Gy
Reference Point Session Dosage Given: 2.66 Gy
Session Number: 13

## 2022-03-08 ENCOUNTER — Other Ambulatory Visit: Payer: Self-pay

## 2022-03-08 ENCOUNTER — Ambulatory Visit: Payer: Medicare Other | Admitting: Radiation Oncology

## 2022-03-08 ENCOUNTER — Ambulatory Visit
Admission: RE | Admit: 2022-03-08 | Discharge: 2022-03-08 | Disposition: A | Discharge status: Home / Self Care | Payer: Medicare Other | Source: Ambulatory Visit | Attending: Radiation Oncology | Admitting: Radiation Oncology

## 2022-03-08 DIAGNOSIS — Z170 Estrogen receptor positive status [ER+]: Secondary | ICD-10-CM | POA: Diagnosis not present

## 2022-03-08 DIAGNOSIS — K219 Gastro-esophageal reflux disease without esophagitis: Secondary | ICD-10-CM | POA: Diagnosis not present

## 2022-03-08 DIAGNOSIS — D0511 Intraductal carcinoma in situ of right breast: Secondary | ICD-10-CM | POA: Diagnosis not present

## 2022-03-08 DIAGNOSIS — Z510 Encounter for antineoplastic radiation therapy: Secondary | ICD-10-CM | POA: Diagnosis not present

## 2022-03-08 DIAGNOSIS — Z85820 Personal history of malignant melanoma of skin: Secondary | ICD-10-CM | POA: Diagnosis not present

## 2022-03-08 DIAGNOSIS — E785 Hyperlipidemia, unspecified: Secondary | ICD-10-CM | POA: Diagnosis not present

## 2022-03-08 LAB — RAD ONC ARIA SESSION SUMMARY
Course Elapsed Days: 18
Plan Fractions Treated to Date: 14
Plan Prescribed Dose Per Fraction: 2.66 Gy
Plan Total Fractions Prescribed: 16
Plan Total Prescribed Dose: 42.56 Gy
Reference Point Dosage Given to Date: 37.24 Gy
Reference Point Session Dosage Given: 2.66 Gy
Session Number: 14

## 2022-03-11 ENCOUNTER — Ambulatory Visit
Admission: RE | Admit: 2022-03-11 | Discharge: 2022-03-11 | Disposition: A | Discharge status: Home / Self Care | Payer: Medicare Other | Source: Ambulatory Visit | Attending: Radiation Oncology | Admitting: Radiation Oncology

## 2022-03-11 ENCOUNTER — Other Ambulatory Visit: Payer: Self-pay

## 2022-03-11 DIAGNOSIS — Z510 Encounter for antineoplastic radiation therapy: Secondary | ICD-10-CM | POA: Diagnosis not present

## 2022-03-11 DIAGNOSIS — Z170 Estrogen receptor positive status [ER+]: Secondary | ICD-10-CM | POA: Diagnosis not present

## 2022-03-11 DIAGNOSIS — D0511 Intraductal carcinoma in situ of right breast: Secondary | ICD-10-CM | POA: Diagnosis not present

## 2022-03-11 LAB — RAD ONC ARIA SESSION SUMMARY
Course Elapsed Days: 21
Plan Fractions Treated to Date: 15
Plan Prescribed Dose Per Fraction: 2.66 Gy
Plan Total Fractions Prescribed: 16
Plan Total Prescribed Dose: 42.56 Gy
Reference Point Dosage Given to Date: 39.9 Gy
Reference Point Session Dosage Given: 2.66 Gy
Session Number: 15

## 2022-03-12 ENCOUNTER — Other Ambulatory Visit: Payer: Self-pay

## 2022-03-12 ENCOUNTER — Ambulatory Visit
Admission: RE | Admit: 2022-03-12 | Discharge: 2022-03-12 | Disposition: A | Discharge status: Home / Self Care | Payer: Medicare Other | Source: Ambulatory Visit | Attending: Radiation Oncology | Admitting: Radiation Oncology

## 2022-03-12 ENCOUNTER — Ambulatory Visit: Payer: Medicare Other

## 2022-03-12 DIAGNOSIS — D0511 Intraductal carcinoma in situ of right breast: Secondary | ICD-10-CM | POA: Diagnosis not present

## 2022-03-12 DIAGNOSIS — Z510 Encounter for antineoplastic radiation therapy: Secondary | ICD-10-CM | POA: Diagnosis not present

## 2022-03-12 DIAGNOSIS — Z170 Estrogen receptor positive status [ER+]: Secondary | ICD-10-CM | POA: Diagnosis not present

## 2022-03-12 LAB — RAD ONC ARIA SESSION SUMMARY
Course Elapsed Days: 22
Plan Fractions Treated to Date: 16
Plan Prescribed Dose Per Fraction: 2.66 Gy
Plan Total Fractions Prescribed: 16
Plan Total Prescribed Dose: 42.56 Gy
Reference Point Dosage Given to Date: 42.56 Gy
Reference Point Session Dosage Given: 2.66 Gy
Session Number: 16

## 2022-03-13 ENCOUNTER — Ambulatory Visit: Payer: Medicare Other

## 2022-03-13 ENCOUNTER — Other Ambulatory Visit: Payer: Self-pay

## 2022-03-13 ENCOUNTER — Ambulatory Visit
Admission: RE | Admit: 2022-03-13 | Discharge: 2022-03-13 | Disposition: A | Discharge status: Home / Self Care | Payer: Medicare Other | Source: Ambulatory Visit | Attending: Radiation Oncology | Admitting: Radiation Oncology

## 2022-03-13 DIAGNOSIS — Z510 Encounter for antineoplastic radiation therapy: Secondary | ICD-10-CM | POA: Diagnosis not present

## 2022-03-13 DIAGNOSIS — D0511 Intraductal carcinoma in situ of right breast: Secondary | ICD-10-CM | POA: Diagnosis not present

## 2022-03-13 LAB — RAD ONC ARIA SESSION SUMMARY
Course Elapsed Days: 23
Plan Fractions Treated to Date: 1
Plan Prescribed Dose Per Fraction: 2 Gy
Plan Total Fractions Prescribed: 4
Plan Total Prescribed Dose: 8 Gy
Reference Point Dosage Given to Date: 2 Gy
Reference Point Session Dosage Given: 2 Gy
Session Number: 17

## 2022-03-14 ENCOUNTER — Other Ambulatory Visit: Payer: Self-pay

## 2022-03-14 ENCOUNTER — Encounter: Payer: Self-pay | Admitting: *Deleted

## 2022-03-14 ENCOUNTER — Ambulatory Visit
Admission: RE | Admit: 2022-03-14 | Discharge: 2022-03-14 | Disposition: A | Discharge status: Home / Self Care | Payer: Medicare Other | Source: Ambulatory Visit | Attending: Radiation Oncology | Admitting: Radiation Oncology

## 2022-03-14 DIAGNOSIS — D0511 Intraductal carcinoma in situ of right breast: Secondary | ICD-10-CM | POA: Diagnosis not present

## 2022-03-14 DIAGNOSIS — Z510 Encounter for antineoplastic radiation therapy: Secondary | ICD-10-CM | POA: Diagnosis not present

## 2022-03-14 LAB — RAD ONC ARIA SESSION SUMMARY
Course Elapsed Days: 24
Plan Fractions Treated to Date: 2
Plan Prescribed Dose Per Fraction: 2 Gy
Plan Total Fractions Prescribed: 4
Plan Total Prescribed Dose: 8 Gy
Reference Point Dosage Given to Date: 4 Gy
Reference Point Session Dosage Given: 2 Gy
Session Number: 18

## 2022-03-15 ENCOUNTER — Other Ambulatory Visit: Payer: Self-pay

## 2022-03-15 ENCOUNTER — Ambulatory Visit: Payer: Medicare Other

## 2022-03-15 ENCOUNTER — Ambulatory Visit
Admission: RE | Admit: 2022-03-15 | Discharge: 2022-03-15 | Disposition: A | Discharge status: Home / Self Care | Payer: Medicare Other | Source: Ambulatory Visit | Attending: Radiation Oncology | Admitting: Radiation Oncology

## 2022-03-15 DIAGNOSIS — D0511 Intraductal carcinoma in situ of right breast: Secondary | ICD-10-CM | POA: Diagnosis not present

## 2022-03-15 DIAGNOSIS — Z510 Encounter for antineoplastic radiation therapy: Secondary | ICD-10-CM | POA: Diagnosis not present

## 2022-03-15 LAB — RAD ONC ARIA SESSION SUMMARY
Course Elapsed Days: 25
Plan Fractions Treated to Date: 3
Plan Prescribed Dose Per Fraction: 2 Gy
Plan Total Fractions Prescribed: 4
Plan Total Prescribed Dose: 8 Gy
Reference Point Dosage Given to Date: 6 Gy
Reference Point Session Dosage Given: 2 Gy
Session Number: 19

## 2022-03-18 ENCOUNTER — Ambulatory Visit
Admission: RE | Admit: 2022-03-18 | Discharge: 2022-03-18 | Disposition: A | Discharge status: Home / Self Care | Payer: Medicare Other | Source: Ambulatory Visit | Attending: Radiation Oncology | Admitting: Radiation Oncology

## 2022-03-18 ENCOUNTER — Encounter: Payer: Self-pay | Admitting: Radiation Oncology

## 2022-03-18 ENCOUNTER — Other Ambulatory Visit: Payer: Self-pay

## 2022-03-18 DIAGNOSIS — D0511 Intraductal carcinoma in situ of right breast: Secondary | ICD-10-CM | POA: Diagnosis not present

## 2022-03-18 DIAGNOSIS — Z170 Estrogen receptor positive status [ER+]: Secondary | ICD-10-CM | POA: Diagnosis not present

## 2022-03-18 DIAGNOSIS — Z510 Encounter for antineoplastic radiation therapy: Secondary | ICD-10-CM | POA: Diagnosis not present

## 2022-03-18 LAB — RAD ONC ARIA SESSION SUMMARY
Course Elapsed Days: 28
Plan Fractions Treated to Date: 4
Plan Prescribed Dose Per Fraction: 2 Gy
Plan Total Fractions Prescribed: 4
Plan Total Prescribed Dose: 8 Gy
Reference Point Dosage Given to Date: 8 Gy
Reference Point Session Dosage Given: 2 Gy
Session Number: 20

## 2022-04-03 DIAGNOSIS — D2271 Melanocytic nevi of right lower limb, including hip: Secondary | ICD-10-CM | POA: Diagnosis not present

## 2022-04-03 DIAGNOSIS — D485 Neoplasm of uncertain behavior of skin: Secondary | ICD-10-CM | POA: Diagnosis not present

## 2022-04-03 DIAGNOSIS — L578 Other skin changes due to chronic exposure to nonionizing radiation: Secondary | ICD-10-CM | POA: Diagnosis not present

## 2022-04-03 DIAGNOSIS — L821 Other seborrheic keratosis: Secondary | ICD-10-CM | POA: Diagnosis not present

## 2022-04-03 DIAGNOSIS — D2262 Melanocytic nevi of left upper limb, including shoulder: Secondary | ICD-10-CM | POA: Diagnosis not present

## 2022-04-03 DIAGNOSIS — L570 Actinic keratosis: Secondary | ICD-10-CM | POA: Diagnosis not present

## 2022-04-03 DIAGNOSIS — D224 Melanocytic nevi of scalp and neck: Secondary | ICD-10-CM | POA: Diagnosis not present

## 2022-04-03 DIAGNOSIS — D2272 Melanocytic nevi of left lower limb, including hip: Secondary | ICD-10-CM | POA: Diagnosis not present

## 2022-04-03 DIAGNOSIS — D225 Melanocytic nevi of trunk: Secondary | ICD-10-CM | POA: Diagnosis not present

## 2022-04-03 DIAGNOSIS — L814 Other melanin hyperpigmentation: Secondary | ICD-10-CM | POA: Diagnosis not present

## 2022-04-03 DIAGNOSIS — Z86018 Personal history of other benign neoplasm: Secondary | ICD-10-CM | POA: Diagnosis not present

## 2022-04-03 DIAGNOSIS — Z808 Family history of malignant neoplasm of other organs or systems: Secondary | ICD-10-CM | POA: Diagnosis not present

## 2022-04-03 DIAGNOSIS — Z87898 Personal history of other specified conditions: Secondary | ICD-10-CM | POA: Diagnosis not present

## 2022-04-03 DIAGNOSIS — Z85828 Personal history of other malignant neoplasm of skin: Secondary | ICD-10-CM | POA: Diagnosis not present

## 2022-04-04 NOTE — Progress Notes (Signed)
                                                                                                                                                             Patient Name: Diane Obrien MRN: [id number] DOB: [birth year] Referring Physician: Benay Pike Date of Service: 03/18/2022 [facility], [place]                                                        End Of Treatment Note  Diagnoses: D05.11-Intraductal carcinoma in situ of right breast  Cancer Staging:  Intermediate grade, ER/PR positive DCIS of the right breast   Intent: Curative  Radiation Treatment Dates: 02/18/2022 through 03/18/2022 Site Technique Total Dose (Gy) Dose per Fx (Gy) Completed Fx Beam Energies  Breast, Right: Breast_R 3D 42.56/42.56 2.66 16/16 10X, 6XFFF  Breast, Right: Breast_R_Bst specialPort 8/8 2 4/4 6E, 9E   Narrative: The patient tolerated radiation therapy relatively well. She developed anticipated skin changes in the treatment field.   Plan: The patient will receive a call in about one month from the radiation oncology department. She will continue follow up with Dr. Chryl Heck as well.   ________________________________________________    Carola Rhine, [facility]

## 2022-04-09 DIAGNOSIS — Z23 Encounter for immunization: Secondary | ICD-10-CM | POA: Diagnosis not present

## 2022-04-22 ENCOUNTER — Ambulatory Visit
Admission: RE | Admit: 2022-04-22 | Discharge: 2022-04-22 | Disposition: A | Discharge status: Home / Self Care | Payer: Federal, State, Local not specified - PPO | Source: Ambulatory Visit | Attending: Radiation Oncology | Admitting: Radiation Oncology

## 2022-04-22 NOTE — Progress Notes (Signed)
  Radiation Oncology         [phone] ________________________________  Name: Diane Obrien MRN: [id number]  Date of Service: 04/22/2022  DOB: 03-14-1951  Post Treatment Telephone Note  Diagnosis:  Intermediate grade, ER/PR positive DCIS of the right breast   Intent: Curative  Radiation Treatment Dates: 02/18/2022 through 03/18/2022 Site Technique Total Dose (Gy) Dose per Fx (Gy) Completed Fx Beam Energies  Breast, Right: Breast_R 3D 42.56/42.56 2.66 16/16 10X, 6XFFF  Breast, Right: Breast_R_Bst specialPort 8/8 2 4/4 6E, 9E  (as documented in provider EOT note)   The patient was available for call today.   Symptoms of fatigue have improved since completing therapy.  Symptoms of skin changes have improved since completing therapy.  The patient was encouraged to avoid sun exposure in the area of prior treatment for up to one year following radiation with either sunscreen or by the style of clothing worn in the sun.  The patient has scheduled follow up with her medical oncologist Dr. Chryl Heck for ongoing surveillance, and was encouraged to call if she develops concerns or questions regarding radiation.  This concludes this nursing interview.  Leandra Kern, LPN

## 2022-06-24 ENCOUNTER — Encounter: Payer: Self-pay | Admitting: Internal Medicine

## 2022-07-01 ENCOUNTER — Inpatient Hospital Stay: Payer: Medicare Other | Admitting: Adult Health

## 2022-07-04 ENCOUNTER — Encounter: Payer: Self-pay | Admitting: Adult Health

## 2022-07-04 ENCOUNTER — Inpatient Hospital Stay: Payer: Federal, State, Local not specified - PPO | Attending: Adult Health | Admitting: Adult Health

## 2022-07-04 VITALS — BP 165/80 | HR 88 | Temp 97.5°F | Resp 17 | Wt 148.1 lb

## 2022-07-04 DIAGNOSIS — Z85820 Personal history of malignant melanoma of skin: Secondary | ICD-10-CM | POA: Insufficient documentation

## 2022-07-04 DIAGNOSIS — Z8041 Family history of malignant neoplasm of ovary: Secondary | ICD-10-CM | POA: Insufficient documentation

## 2022-07-04 DIAGNOSIS — D0511 Intraductal carcinoma in situ of right breast: Secondary | ICD-10-CM | POA: Insufficient documentation

## 2022-07-04 DIAGNOSIS — Z87891 Personal history of nicotine dependence: Secondary | ICD-10-CM | POA: Insufficient documentation

## 2022-07-04 DIAGNOSIS — Z79810 Long term (current) use of selective estrogen receptor modulators (SERMs): Secondary | ICD-10-CM | POA: Insufficient documentation

## 2022-07-04 DIAGNOSIS — Z803 Family history of malignant neoplasm of breast: Secondary | ICD-10-CM | POA: Diagnosis not present

## 2022-07-04 NOTE — Progress Notes (Signed)
SURVIVORSHIP VISIT:   BRIEF ONCOLOGIC HISTORY:  Oncology History  DCIS (ductal carcinoma in situ) of breast  10/03/2021 Mammogram   Mammogram showed suspicious calcifications within the upper outer quadrant of the right breast for which stereotactic biopsy is recommended.    10/11/2021 Pathology Results   Biopsy from the right breast needle core biopsy showed DCIS with solid growth pattern, microcalcification and comedonecrosis, linear extent of DCIS 8 mm, nuclear grade 1, ER 100% positive strong staining PR 15% positive strong to moderate staining intensity.   11/29/2021 Initial Diagnosis   DCIS (ductal carcinoma in situ) of breast   01/04/2022 Pathology Results   Right breast lumpectomy showed DCIS, intermediate grade, 2.2 cm with lobular cancerization, no evidence of invasive carcinoma.  Repeat margin reexcision showed DCIS, intermediate grade, 1 cm, focally 0.1 cm from inferior margin.  Changes consistent with previous lumpectomy   01/04/2022 Cancer Staging   Staging form: Breast, AJCC 8th Edition - Clinical stage from 01/04/2022: Stage 0 (cTis (DCIS), cN0, cM0, ER+, PR+) - Signed by Gardenia Phlegm, NP on 07/04/2022   02/18/2022 - 03/18/2022 Radiation Therapy   Site Technique Total Dose (Gy) Dose per Fx (Gy) Completed Fx Beam Energies  Breast, Right: Breast_R 3D 42.56/42.56 2.66 16/16 10X, 6XFFF  Breast, Right: Breast_R_Bst specialPort 8/8 2 4/4 6E, 9E     03/2022 -  Anti-estrogen oral therapy   Tamoxifen     INTERVAL HISTORY:  Diane Obrien to review her survivorship care plan detailing her treatment course for breast cancer, as well as monitoring long-term side effects of that treatment, education regarding health maintenance, screening, and overall wellness and health promotion.     Overall, Diane Obrien reports feeling quite well.  She is taking Tamoxifen daily with good tolerance.  REVIEW OF SYSTEMS:  Review of Systems  Constitutional:  Negative for appetite change, chills,  fatigue, fever and unexpected weight change.  HENT:   Negative for hearing loss, lump/mass and trouble swallowing.   Eyes:  Negative for eye problems and icterus.  Respiratory:  Negative for chest tightness, cough and shortness of breath.   Cardiovascular:  Negative for chest pain, leg swelling and palpitations.  Gastrointestinal:  Negative for abdominal distention, abdominal pain, constipation, diarrhea, nausea and vomiting.  Endocrine: Negative for hot flashes.  Genitourinary:  Negative for difficulty urinating.   Musculoskeletal:  Negative for arthralgias.  Skin:  Negative for itching and rash.  Neurological:  Negative for dizziness, extremity weakness, headaches and numbness.  Hematological:  Negative for adenopathy. Does not bruise/bleed easily.  Psychiatric/Behavioral:  Negative for depression. The patient is not nervous/anxious.   Breast: Denies any new nodularity, masses, tenderness, nipple changes, or nipple discharge.     PAST MEDICAL/SURGICAL HISTORY:  Past Medical History:  Diagnosis Date   Arthritis    knees   Breast cancer ([place]) 10/11/2021   GERD (gastroesophageal reflux disease)    Hyperlipidemia    Melanoma ([place]) 06/10/2010   nose   PONV (postoperative nausea and vomiting)    Vertigo    Past Surgical History:  Procedure Laterality Date   ANAL FISSURE REPAIR      x2   AUGMENTATION MAMMAPLASTY Bilateral    removed 2021   BREAST LUMPECTOMY WITH RADIOACTIVE SEED LOCALIZATION Right 12/07/2021   Procedure: RIGHT BREAST LUMPECTOMY WITH RADIOACTIVE SEED LOCALIZATION;  Surgeon: Jovita Kussmaul, MD;  Location: [facility];  Service: General;  Laterality: Right;   [place]  RE-EXCISION OF BREAST LUMPECTOMY Right 01/04/2022   Procedure: RE-EXCISION RIGHT BREAST INFERIOR MARGINS;  Surgeon: Jovita Kussmaul, MD;  Location: [facility];  Service: General;  Laterality: Right;      ALLERGIES:  Allergies  Allergen Reactions   Other    Pravastatin Sodium Other (See Comments)   Sulfa Antibiotics Swelling     CURRENT MEDICATIONS:  Outpatient Encounter Medications as of 07/04/2022  Medication Sig   Multiple Vitamins-Minerals (MULTIVITAMIN WITH MINERALS) tablet Take 1 tablet by mouth daily.   tamoxifen (NOLVADEX) 10 MG tablet Take 0.5 tablets (5 mg total) by mouth daily.   meclizine (ANTIVERT) 25 MG tablet Take by mouth. (Patient not taking: Reported on 07/04/2022)   oxyCODONE (ROXICODONE) 5 MG immediate release tablet Take 1 tablet (5 mg total) by mouth every 6 (six) hours as needed for severe pain. (Patient not taking: Reported on 07/04/2022)   oxyCODONE (ROXICODONE) 5 MG immediate release tablet Take 1 tablet (5 mg total) by mouth every 6 (six) hours as needed for severe pain. (Patient not taking: Reported on 07/04/2022)   No facility-administered encounter medications on file as of 07/04/2022.     ONCOLOGIC FAMILY HISTORY:  Family History  Problem Relation Age of Onset   Breast cancer Mother        age 2 or 39   Ovarian cancer Maternal Grandmother    Colon cancer Neg Hx    Stomach cancer Neg Hx      SOCIAL HISTORY:  Social History   Socioeconomic History   Marital status: Married    Spouse name: Not on file   Number of children: Not on file   Years of education: Not on file   Highest education level: Not on file  Occupational History   Not on file  Tobacco Use   Smoking status: Former    Types: Cigarettes    Quit date: 05/15/1983    Years since quitting: 39.1   Smokeless tobacco: Not on file  Substance and Sexual Activity   Alcohol use: No   Drug use: No   Sexual activity: Not on file  Other Topics Concern   Not on file  Social History Narrative   Not on file   Social Determinants of Health   Financial Resource Strain: Not on file  Food Insecurity: Not on file  Transportation Needs: Not on file  Physical Activity: Not on file   Stress: Not on file  Social Connections: Not on file  Intimate Partner Violence: Not on file     OBSERVATIONS/OBJECTIVE:  BP (!) 165/80 (BP Location: Left Arm, Patient Position: Sitting)   Pulse 88   Temp (!) 97.5 F (36.4 C) (Temporal)   Resp 17   Wt 148 lb 2 oz (67.2 kg)   SpO2 99%   BMI 25.43 kg/m  GENERAL: Patient is a well appearing female in no acute distress HEENT:  Sclerae anicteric.  Oropharynx clear and moist. No ulcerations or evidence of oropharyngeal candidiasis. Neck is supple.  NODES:  No cervical, supraclavicular, or axillary lymphadenopathy palpated.  BREAST EXAM: Right breast status postlumpectomy and radiation no sign of local recurrence left breast is benign. LUNGS:  Clear to auscultation bilaterally.  No wheezes or rhonchi. HEART:  Regular rate and rhythm. No murmur appreciated. ABDOMEN:  Soft, nontender.  Positive, normoactive bowel sounds. No organomegaly palpated. MSK:  No focal spinal tenderness to palpation. Full range of motion bilaterally in the upper extremities. EXTREMITIES:  No peripheral edema.   SKIN:  Clear  with no obvious rashes or skin changes. No nail dyscrasia. NEURO:  Nonfocal. Well oriented.  Appropriate affect.  LABORATORY DATA:  None for this visit.  DIAGNOSTIC IMAGING:  None for this visit.      ASSESSMENT AND PLAN:  Diane Obrien is a pleasant 72 y.o. 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  She presents to the Survivorship Clinic for our initial meeting and routine follow-up post-completion of treatment for breast cancer.    1. Stage 0 right breast cancer:  Ms. Plott is continuing to recover from definitive treatment for breast cancer. She will follow-up with her medical oncologist, Dr. Chryl Heck in 09/2022 with history and physical exam per surveillance protocol.  She  will continue her anti-estrogen therapy with Tamoxifen.  Her mammogram is due 09/2021; orders placed today.   Today, a comprehensive survivorship care plan and treatment summary was reviewed with the patient today detailing her breast cancer diagnosis, treatment course, potential late/long-term effects of treatment, appropriate follow-up care with recommendations for the future, and patient education resources.  A copy of this summary, along with a letter will be sent to the patient's primary care provider via mail/fax/In Basket message after today's visit.    2. Bone health:  She was given education on specific activities to promote bone health.  3. Cancer screening:  Due to Ms. Petitti's history and her age, she should receive screening for skin cancers, colon cancer.  The information and recommendations are listed on the patient's comprehensive care plan/treatment summary and were reviewed in detail with the patient.    4. Health maintenance and wellness promotion: Ms. Roeper was encouraged to consume 5-7 servings of fruits and vegetables per day. We reviewed the "Nutrition Rainbow" handout.  She was also encouraged to engage in moderate to vigorous exercise for 30 minutes per day most days of the week. She was instructed to limit her alcohol consumption and continue to abstain from tobacco use.     5. Support services/counseling: It is not uncommon for this period of the patient's cancer care trajectory to be one of many emotions and stressors.   She was given information regarding our available services and encouraged to contact me with any questions or for help enrolling in any of our support group/programs.    Follow up instructions:    -Return to cancer center 09/2022 for f/u with Dr. Chryl Heck  -Mammogram due in 09/2022 -She is welcome to return back to the Survivorship Clinic at any time; no additional follow-up needed at this time.  -Consider referral back to survivorship as a long-term survivor  for continued surveillance  The patient was provided an opportunity to ask questions and all were answered. The patient agreed with the plan and demonstrated an understanding of the instructions.   Total encounter time:40 minutes*in face-to-face visit time, chart review, lab review, care coordination, order entry, and documentation of the encounter time.    Wilber Bihari, NP 07/04/22 12:39 PM Medical Oncology and Hematology [facility] [place], [place] [address] Tel. [phone]    Fax. [phone]  *Total Encounter Time as defined by the Centers for Medicare and Medicaid Services includes, in addition to the face-to-face time of a patient visit (documented in the note above) non-face-to-face time: obtaining and reviewing outside history, ordering and reviewing medications, tests or procedures, care coordination (communications with other health  care professionals or caregivers) and documentation in the medical record.

## 2022-07-04 NOTE — Progress Notes (Signed)
Called and LVM with [name] requesting Pt's latest genetic testing results per NP request. Fax number and call back number given with any questions.

## 2022-08-13 DIAGNOSIS — D0511 Intraductal carcinoma in situ of right breast: Secondary | ICD-10-CM | POA: Diagnosis not present

## 2022-08-23 ENCOUNTER — Encounter: Payer: Self-pay | Admitting: Hematology and Oncology

## 2022-09-24 DIAGNOSIS — N760 Acute vaginitis: Secondary | ICD-10-CM | POA: Diagnosis not present

## 2022-09-24 DIAGNOSIS — Z01419 Encounter for gynecological examination (general) (routine) without abnormal findings: Secondary | ICD-10-CM | POA: Diagnosis not present

## 2022-09-25 ENCOUNTER — Other Ambulatory Visit: Payer: Self-pay | Admitting: Obstetrics and Gynecology

## 2022-09-25 DIAGNOSIS — E2839 Other primary ovarian failure: Secondary | ICD-10-CM

## 2022-09-30 ENCOUNTER — Inpatient Hospital Stay: Payer: Medicare Other | Attending: Adult Health | Admitting: Hematology and Oncology

## 2022-09-30 VITALS — BP 141/69 | HR 84 | Temp 97.7°F | Resp 18 | Ht 64.0 in | Wt 147.7 lb

## 2022-09-30 DIAGNOSIS — D0511 Intraductal carcinoma in situ of right breast: Secondary | ICD-10-CM | POA: Diagnosis not present

## 2022-09-30 DIAGNOSIS — Z800 Family history of malignant neoplasm of digestive organs: Secondary | ICD-10-CM | POA: Diagnosis not present

## 2022-09-30 DIAGNOSIS — Z8041 Family history of malignant neoplasm of ovary: Secondary | ICD-10-CM | POA: Diagnosis not present

## 2022-09-30 DIAGNOSIS — Z803 Family history of malignant neoplasm of breast: Secondary | ICD-10-CM | POA: Diagnosis not present

## 2022-09-30 DIAGNOSIS — R232 Flushing: Secondary | ICD-10-CM | POA: Diagnosis not present

## 2022-09-30 DIAGNOSIS — Z87891 Personal history of nicotine dependence: Secondary | ICD-10-CM | POA: Insufficient documentation

## 2022-09-30 DIAGNOSIS — Z79810 Long term (current) use of selective estrogen receptor modulators (SERMs): Secondary | ICD-10-CM | POA: Insufficient documentation

## 2022-09-30 DIAGNOSIS — Z923 Personal history of irradiation: Secondary | ICD-10-CM | POA: Insufficient documentation

## 2022-09-30 NOTE — Progress Notes (Signed)
BRIEF ONCOLOGIC HISTORY:  Oncology History  DCIS (ductal carcinoma in situ) of breast  10/03/2021 Mammogram   Mammogram showed suspicious calcifications within the upper outer quadrant of the right breast for which stereotactic biopsy is recommended.    10/11/2021 Pathology Results   Biopsy from the right breast needle core biopsy showed DCIS with solid growth pattern, microcalcification and comedonecrosis, linear extent of DCIS 8 mm, nuclear grade 1, ER 100% positive strong staining PR 15% positive strong to moderate staining intensity.   11/29/2021 Initial Diagnosis   DCIS (ductal carcinoma in situ) of breast   01/04/2022 Pathology Results   Right breast lumpectomy showed DCIS, intermediate grade, 2.2 cm with lobular cancerization, no evidence of invasive carcinoma.  Repeat margin reexcision showed DCIS, intermediate grade, 1 cm, focally 0.1 cm from inferior margin.  Changes consistent with previous lumpectomy   01/04/2022 Cancer Staging   Staging form: Breast, AJCC 8th Edition - Clinical stage from 01/04/2022: Stage 0 (cTis (DCIS), cN0, cM0, ER+, PR+) - Signed by Loa Socks, NP on 07/04/2022   02/18/2022 - 03/18/2022 Radiation Therapy   Site Technique Total Dose (Gy) Dose per Fx (Gy) Completed Fx Beam Energies  Breast, Right: Breast_R 3D 42.56/42.56 2.66 16/16 10X, 6XFFF  Breast, Right: Breast_R_Bst specialPort 8/8 2 4/4 6E, 9E    03/2022 -  Anti-estrogen oral therapy   Tamoxifen     INTERVAL HISTORY:    She is doing really well, has mild hot flashes on Tamoxifen. She is exercising, although she may have fell off track. She denies any breast changes otherwise. Mammogram scheduled for 4/30. Rest of the pertinent 10 point ROS reviewed and negative.  REVIEW OF SYSTEMS:  Review of Systems  Constitutional:  Negative for appetite change, chills, fatigue, fever and unexpected weight change.  HENT:   Negative for hearing loss, lump/mass and trouble swallowing.   Eyes:   Negative for eye problems and icterus.  Respiratory:  Negative for chest tightness, cough and shortness of breath.   Cardiovascular:  Negative for chest pain, leg swelling and palpitations.  Gastrointestinal:  Negative for abdominal distention, abdominal pain, constipation, diarrhea, nausea and vomiting.  Endocrine: Negative for hot flashes.  Genitourinary:  Negative for difficulty urinating.   Musculoskeletal:  Negative for arthralgias.  Skin:  Negative for itching and rash.  Neurological:  Negative for dizziness, extremity weakness, headaches and numbness.  Hematological:  Negative for adenopathy. Does not bruise/bleed easily.  Psychiatric/Behavioral:  Negative for depression. The patient is not nervous/anxious.   Breast: Denies any new nodularity, masses, tenderness, nipple changes, or nipple discharge.     PAST MEDICAL/SURGICAL HISTORY:  Past Medical History:  Diagnosis Date   Arthritis    knees   Breast cancer ([facility]) 10/11/2021   GERD (gastroesophageal reflux disease)    Hyperlipidemia    Melanoma ([facility]) 06/10/2010   nose   PONV (postoperative nausea and vomiting)    Vertigo    Past Surgical History:  Procedure Laterality Date   ANAL FISSURE REPAIR      x2   AUGMENTATION MAMMAPLASTY Bilateral    removed 2021   BREAST LUMPECTOMY WITH RADIOACTIVE SEED LOCALIZATION Right 12/07/2021   Procedure: RIGHT BREAST LUMPECTOMY WITH RADIOACTIVE SEED LOCALIZATION;  Surgeon: Griselda Miner, MD;  Location: [facility];  Service: General;  Laterality: Right;   CESAREAN SECTION  1988, 1990   HEMORRHOIDECTOMY WITH HEMORRHOID BANDING     RE-EXCISION OF BREAST LUMPECTOMY Right 01/04/2022   Procedure: RE-EXCISION RIGHT BREAST INFERIOR MARGINS;  Surgeon:  Griselda Miner, MD;  Location: [facility];  Service: General;  Laterality: Right;     ALLERGIES:  Allergies  Allergen Reactions   Other    Pravastatin Sodium Other (See Comments)   Sulfa Antibiotics Swelling      CURRENT MEDICATIONS:  Outpatient Encounter Medications as of 09/30/2022  Medication Sig   meclizine (ANTIVERT) 25 MG tablet Take by mouth. (Patient not taking: Reported on 07/04/2022)   Multiple Vitamins-Minerals (MULTIVITAMIN WITH MINERALS) tablet Take 1 tablet by mouth daily.   oxyCODONE (ROXICODONE) 5 MG immediate release tablet Take 1 tablet (5 mg total) by mouth every 6 (six) hours as needed for severe pain. (Patient not taking: Reported on 07/04/2022)   oxyCODONE (ROXICODONE) 5 MG immediate release tablet Take 1 tablet (5 mg total) by mouth every 6 (six) hours as needed for severe pain. (Patient not taking: Reported on 07/04/2022)   tamoxifen (NOLVADEX) 10 MG tablet Take 0.5 tablets (5 mg total) by mouth daily.   No facility-administered encounter medications on file as of 09/30/2022.     ONCOLOGIC FAMILY HISTORY:  Family History  Problem Relation Age of Onset   Breast cancer Mother        age 34 or 46   Ovarian cancer Maternal Grandmother    Colon cancer Neg Hx    Stomach cancer Neg Hx      SOCIAL HISTORY:  Social History   Socioeconomic History   Marital status: Married    Spouse name: Not on file   Number of children: Not on file   Years of education: Not on file   Highest education level: Not on file  Occupational History   Not on file  Tobacco Use   Smoking status: Former    Types: Cigarettes    Quit date: 05/15/1983    Years since quitting: 39.4   Smokeless tobacco: Not on file  Substance and Sexual Activity   Alcohol use: No   Drug use: No   Sexual activity: Not on file  Other Topics Concern   Not on file  Social History Narrative   Not on file   Social Determinants of Health   Financial Resource Strain: Not on file  Food Insecurity: Not on file  Transportation Needs: Not on file  Physical Activity: Not on file  Stress: Not on file  Social Connections: Not on file  Intimate Partner Violence: Not on file     OBSERVATIONS/OBJECTIVE:  BP (!)  141/69 (BP Location: Left Arm, Patient Position: Sitting)   Pulse 84   Temp 97.7 F (36.5 C) (Temporal)   Resp 18   Ht  (1.626 m)   Wt 147 lb 11.2 oz (67 kg)   SpO2 100%   BMI 25.35 kg/m  GENERAL: Patient is a well appearing female in no acute distress HEENT:  Sclerae anicteric.  Oropharynx clear and moist. No ulcerations or evidence of oropharyngeal candidiasis. Neck is supple.  NODES:  No cervical, supraclavicular, or axillary lymphadenopathy palpated.  BREAST EXAM: Right breast status postlumpectomy and radiation no sign of local recurrence left breast is benign. Bilaterally chronically inverted nipples. LUNGS:  Clear to auscultation bilaterally.  No wheezes or rhonchi. HEART:  Regular rate and rhythm. No murmur appreciated. ABDOMEN:  Soft, nontender.  Positive, normoactive bowel sounds. No organomegaly palpated. MSK:  No focal spinal tenderness to palpation. Full range of motion bilaterally in the upper extremities. EXTREMITIES:  No peripheral edema.   SKIN:  Clear with no obvious rashes or skin  changes. No nail dyscrasia. NEURO:  Nonfocal. Well oriented.  Appropriate affect.  LABORATORY DATA:  None for this visit.  DIAGNOSTIC IMAGING:  None for this visit.   ASSESSMENT AND PLAN:   Diane Obrien is a pleasant 72 y.o. 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  Mammogram scheduled for 10/08/2022. No concerns on breast exam, post op changes noted in the right breast and chronically inverted nipples She will RTC in 1 yr, she will follow up with Dr Carolynne Edouard in fall. We will try to alternate appointments between our office and Dr Carolynne Edouard. She was encouraged to do SBE and report any changes to [name] or call us for sooner appointment.  Total encounter time:30 minutes*in face-to-face visit time, chart review, lab review, care  coordination, order entry, and documentation of the encounter time. *Total Encounter Time as defined by the Centers for Medicare and Medicaid Services includes, in addition to the face-to-face time of a patient visit (documented in the note above) non-face-to-face time: obtaining and reviewing outside history, ordering and reviewing medications, tests or procedures, care coordination (communications with other health care professionals or caregivers) and documentation in the medical record.

## 2022-10-08 ENCOUNTER — Ambulatory Visit
Admission: RE | Admit: 2022-10-08 | Discharge: 2022-10-08 | Disposition: A | Discharge status: Home / Self Care | Payer: Federal, State, Local not specified - PPO | Source: Ambulatory Visit | Attending: Adult Health | Admitting: Adult Health

## 2022-10-08 DIAGNOSIS — D0511 Intraductal carcinoma in situ of right breast: Secondary | ICD-10-CM

## 2022-10-08 DIAGNOSIS — Z853 Personal history of malignant neoplasm of breast: Secondary | ICD-10-CM | POA: Diagnosis not present

## 2022-10-08 DIAGNOSIS — Z1231 Encounter for screening mammogram for malignant neoplasm of breast: Secondary | ICD-10-CM | POA: Diagnosis not present

## 2022-10-08 HISTORY — DX: Personal history of irradiation: Z92.3

## 2022-10-29 DIAGNOSIS — N760 Acute vaginitis: Secondary | ICD-10-CM | POA: Diagnosis not present

## 2022-11-05 DIAGNOSIS — H2513 Age-related nuclear cataract, bilateral: Secondary | ICD-10-CM | POA: Diagnosis not present

## 2022-11-05 DIAGNOSIS — H353121 Nonexudative age-related macular degeneration, left eye, early dry stage: Secondary | ICD-10-CM | POA: Diagnosis not present

## 2022-11-05 DIAGNOSIS — H02422 Myogenic ptosis of left eyelid: Secondary | ICD-10-CM | POA: Diagnosis not present

## 2022-11-05 DIAGNOSIS — H1089 Other conjunctivitis: Secondary | ICD-10-CM | POA: Diagnosis not present

## 2022-11-05 DIAGNOSIS — H353112 Nonexudative age-related macular degeneration, right eye, intermediate dry stage: Secondary | ICD-10-CM | POA: Diagnosis not present

## 2022-12-04 IMAGING — MG MM DIGITAL DIAGNOSTIC UNILAT*R*
3 series · 3 of 3 positions shown · non-contrast
Comparison: Previous exams including recent screening mammogram
dated 09/10/2021.

CLINICAL DATA: Patient returns today to evaluate RIGHT breast
calcifications identified on a recent screening mammogram.

EXAM:
DIGITAL DIAGNOSTIC UNILATERAL RIGHT MAMMOGRAM
TECHNIQUE: Right digital diagnostic mammography was performed. Mammographic
images were processed with CAD.

[R CC]
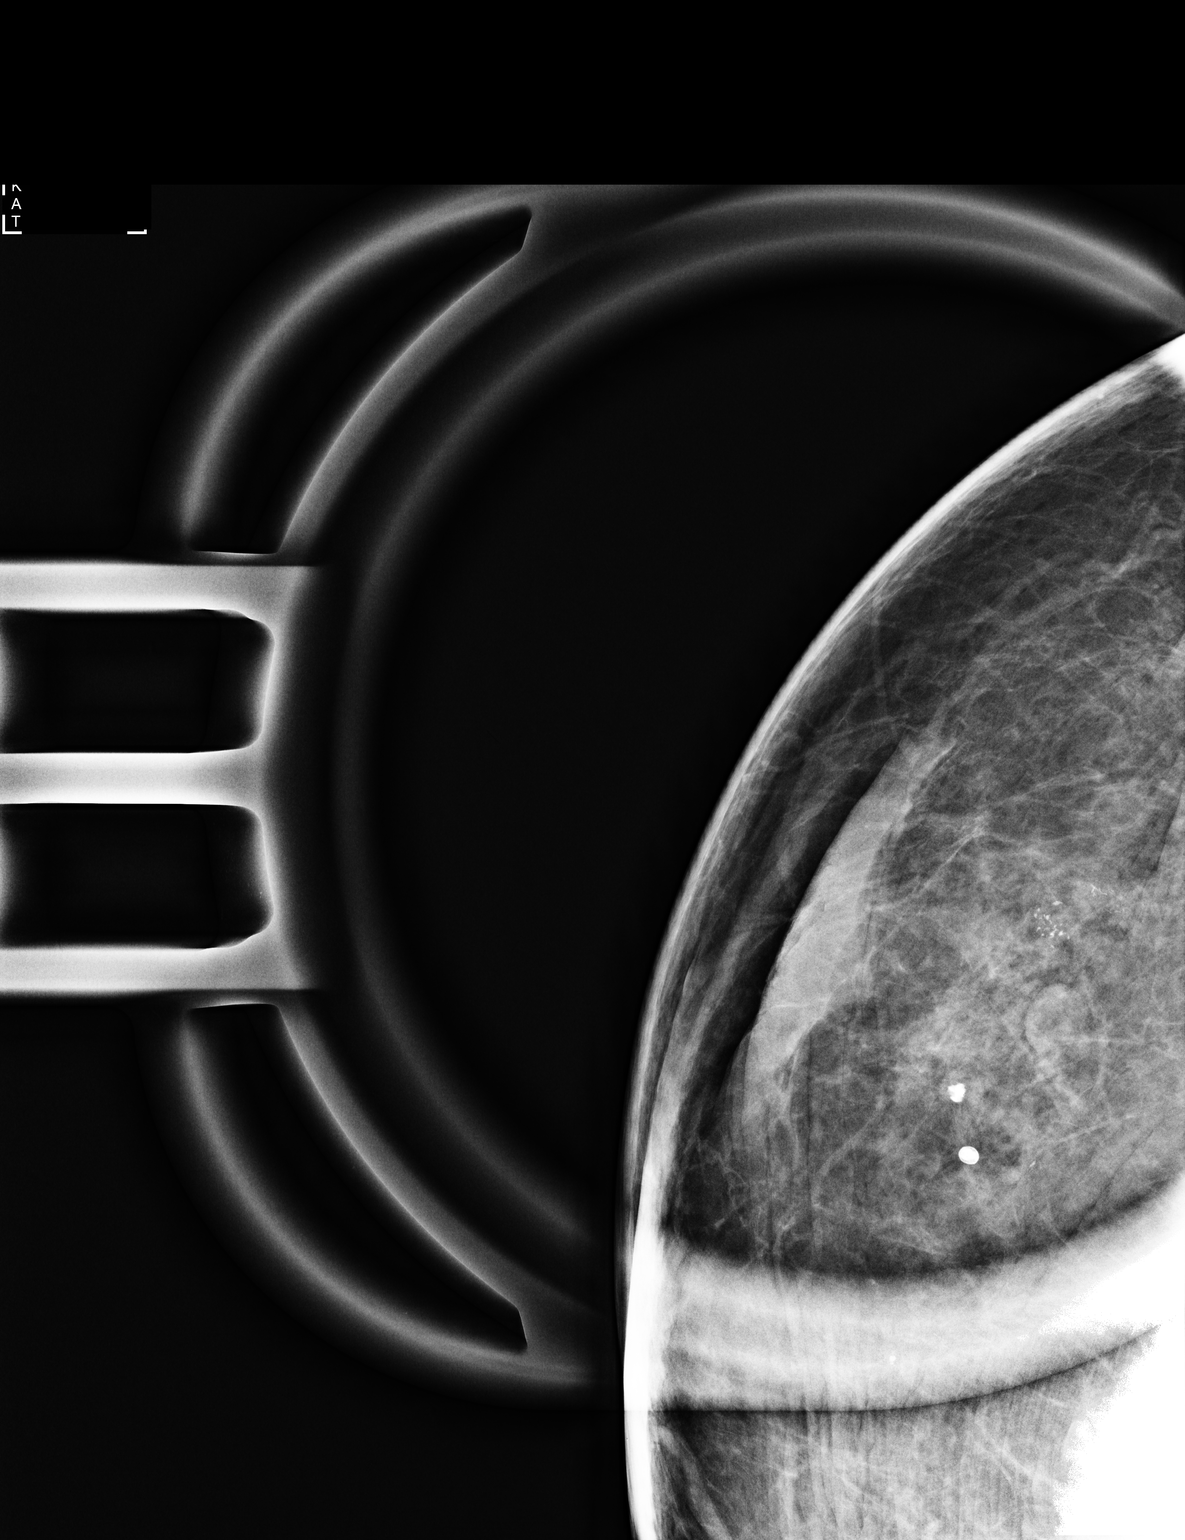

[R ML (1 of 2)]
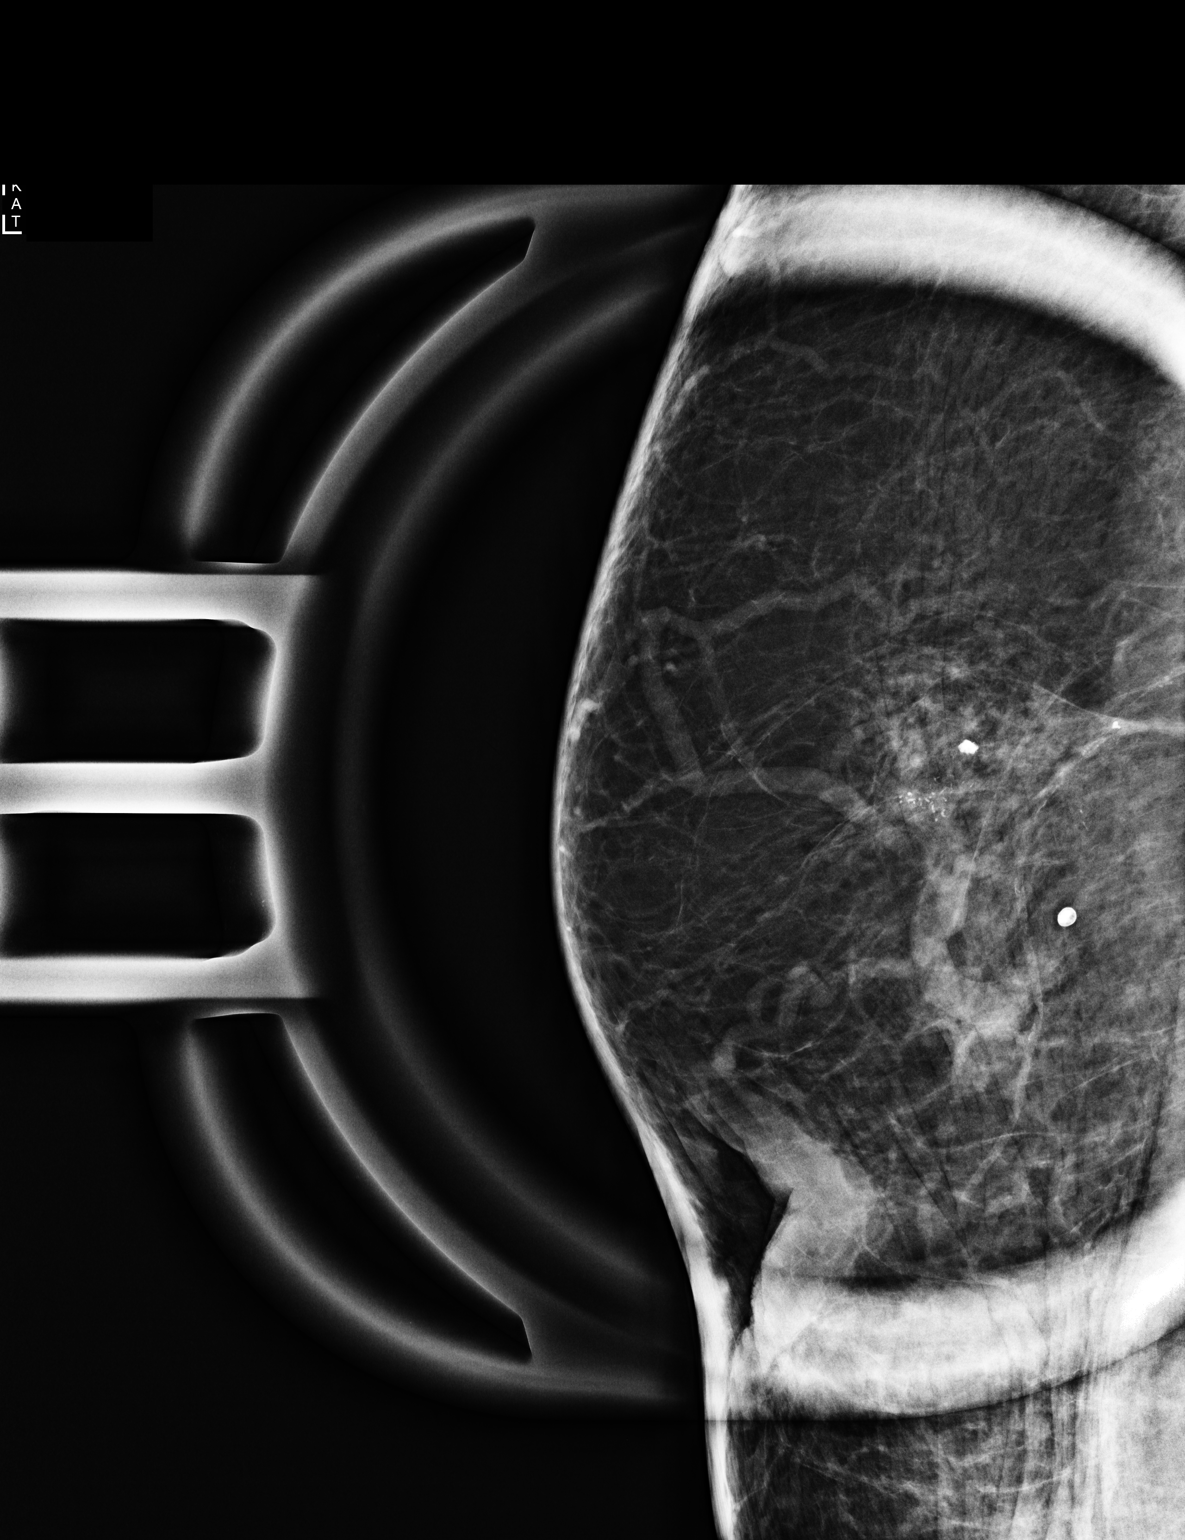

[R ML (2 of 2)]
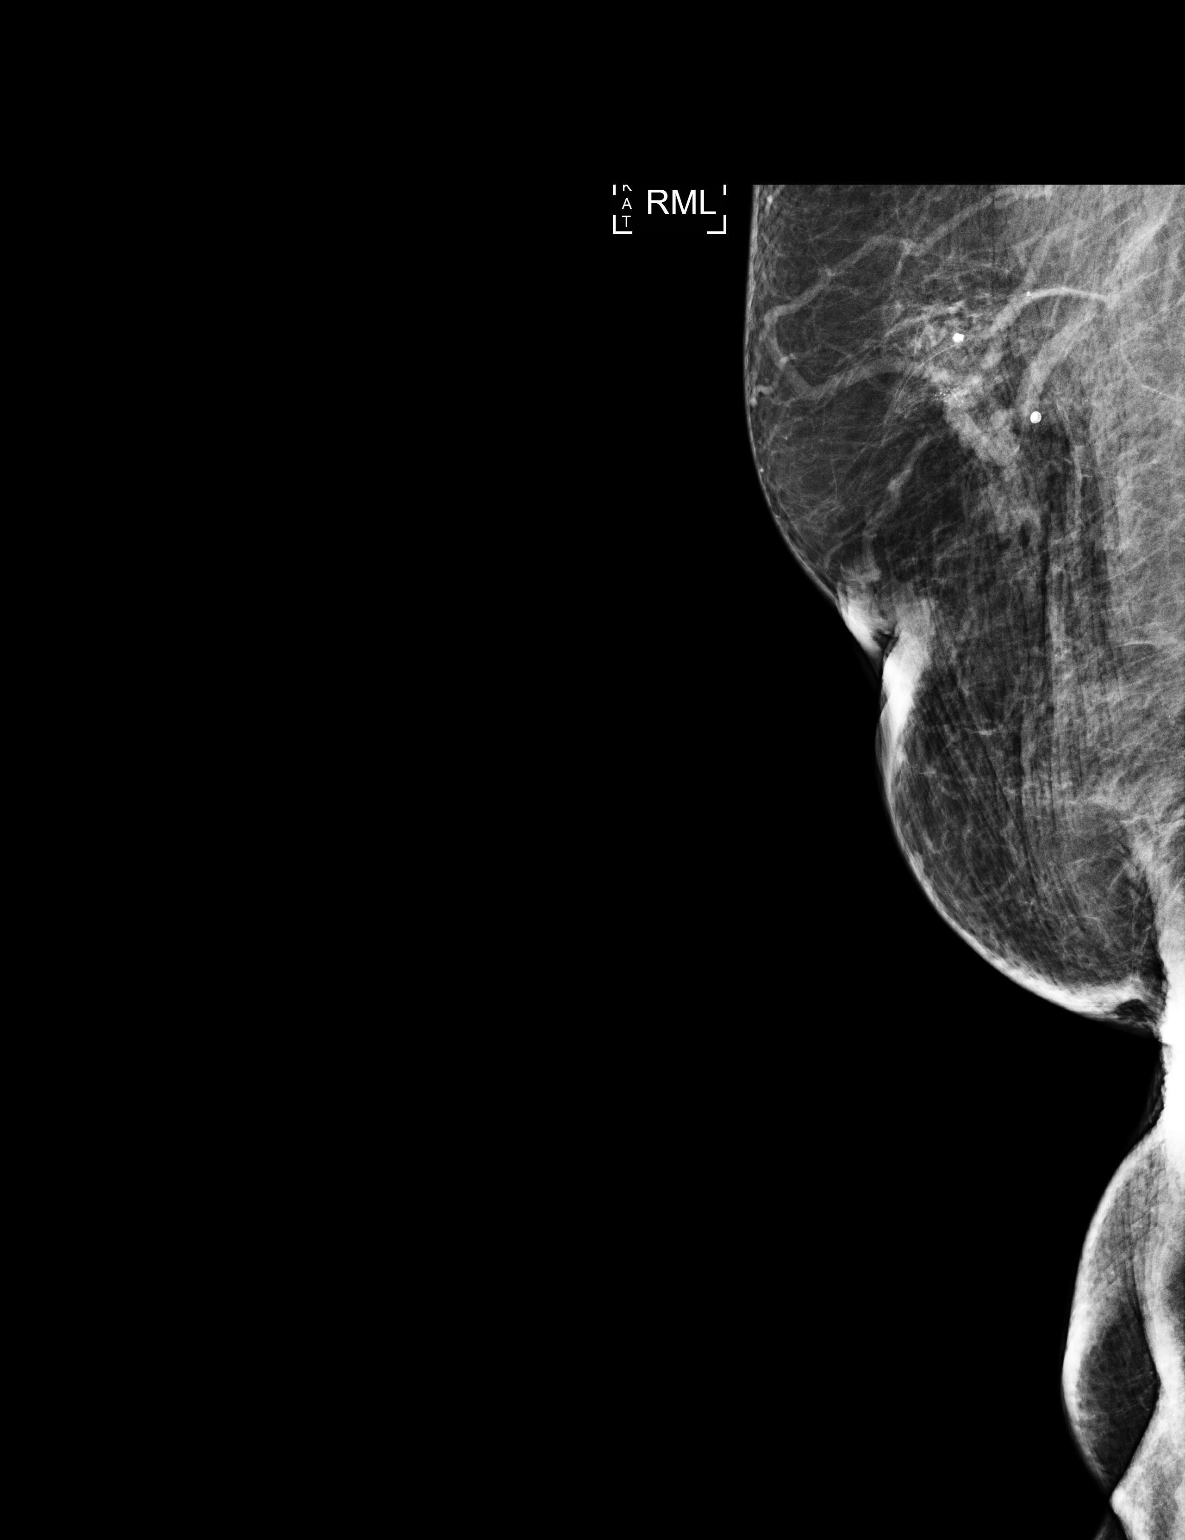

[3 of 3 positions shown; findings below may reference images not displayed]

ACR Breast Density Category b: There are scattered areas of
fibroglandular density.
FINDINGS: On today's additional diagnostic views, including magnification
views, there are grouped fine pleomorphic calcifications confirmed
within the upper-outer quadrant of the RIGHT breast, at middle
depth, the main component measuring 6 mm extent, but with several
additional loosely grouped calcifications posteriorly that extends
the overall extent to 1.6 cm.
IMPRESSION: Suspicious calcifications within the upper-outer quadrant of the
RIGHT breast for which stereotactic biopsy is recommended.

RECOMMENDATION:
Stereotactic biopsy for the RIGHT breast calcifications.

Biopsy is scheduled on [REDACTED].

I have discussed the findings and recommendations with the patient.
If applicable, a reminder letter will be sent to the patient
regarding the next appointment.

BI-RADS CATEGORY  4: Suspicious.

## 2022-12-17 ENCOUNTER — Telehealth: Payer: Self-pay | Admitting: *Deleted

## 2022-12-17 NOTE — Telephone Encounter (Signed)
Patient called. States she started noticing that she was shedding hair in May and it has increased. She states it's a hair here and there, not clumps, but it is increasing. She does not use a brush and does not use blow dryer. She started Tamoxifen in October 2023 and realized this may be a side effect. Acknowledged that this can be a side effect of this medication.  Encouraged Ms. Scheid to continue her current hair care and notify office if it suddenly becomes worse.  She wanted to know if there were any products she should or shoucldn't use to slow the hair loss.  Message routed to Dr. Al Pimple and support staff

## 2022-12-24 DIAGNOSIS — Z87898 Personal history of other specified conditions: Secondary | ICD-10-CM | POA: Diagnosis not present

## 2022-12-24 DIAGNOSIS — Z0000 Encounter for general adult medical examination without abnormal findings: Secondary | ICD-10-CM | POA: Diagnosis not present

## 2022-12-24 DIAGNOSIS — Z23 Encounter for immunization: Secondary | ICD-10-CM | POA: Diagnosis not present

## 2022-12-24 DIAGNOSIS — E782 Mixed hyperlipidemia: Secondary | ICD-10-CM | POA: Diagnosis not present

## 2022-12-24 DIAGNOSIS — E042 Nontoxic multinodular goiter: Secondary | ICD-10-CM | POA: Diagnosis not present

## 2022-12-24 DIAGNOSIS — Z1211 Encounter for screening for malignant neoplasm of colon: Secondary | ICD-10-CM | POA: Diagnosis not present

## 2022-12-24 DIAGNOSIS — K219 Gastro-esophageal reflux disease without esophagitis: Secondary | ICD-10-CM | POA: Diagnosis not present

## 2022-12-24 DIAGNOSIS — Z1331 Encounter for screening for depression: Secondary | ICD-10-CM | POA: Diagnosis not present

## 2022-12-24 DIAGNOSIS — C50911 Malignant neoplasm of unspecified site of right female breast: Secondary | ICD-10-CM | POA: Diagnosis not present

## 2022-12-24 DIAGNOSIS — Z85820 Personal history of malignant melanoma of skin: Secondary | ICD-10-CM | POA: Diagnosis not present

## 2022-12-27 DIAGNOSIS — M25562 Pain in left knee: Secondary | ICD-10-CM | POA: Diagnosis not present

## 2023-01-28 DIAGNOSIS — M25562 Pain in left knee: Secondary | ICD-10-CM | POA: Diagnosis not present

## 2023-01-29 ENCOUNTER — Telehealth: Payer: Self-pay | Admitting: Gastroenterology

## 2023-01-29 ENCOUNTER — Encounter: Payer: Self-pay | Admitting: Gastroenterology

## 2023-01-29 NOTE — Telephone Encounter (Signed)
OK to schedule her colonoscopy with me.

## 2023-01-29 NOTE — Telephone Encounter (Signed)
Hi Dr. Russella Dar,    Patient is a former Dr. Marzetta Board patient. Dr. Leonides Schanz is on patient's recall colonoscopy. Patient is requesting to have you for her next colonoscopy. Patient states she would like to see you due to her family members being a patient of yours. Please review and advise on scheduling.     Thank you.

## 2023-02-04 DIAGNOSIS — M25562 Pain in left knee: Secondary | ICD-10-CM | POA: Diagnosis not present

## 2023-02-10 DIAGNOSIS — H109 Unspecified conjunctivitis: Secondary | ICD-10-CM | POA: Diagnosis not present

## 2023-02-17 DIAGNOSIS — M25562 Pain in left knee: Secondary | ICD-10-CM | POA: Diagnosis not present

## 2023-02-20 DIAGNOSIS — H10023 Other mucopurulent conjunctivitis, bilateral: Secondary | ICD-10-CM | POA: Diagnosis not present

## 2023-02-20 DIAGNOSIS — R531 Weakness: Secondary | ICD-10-CM | POA: Diagnosis not present

## 2023-02-20 DIAGNOSIS — S83242D Other tear of medial meniscus, current injury, left knee, subsequent encounter: Secondary | ICD-10-CM | POA: Diagnosis not present

## 2023-02-25 ENCOUNTER — Ambulatory Visit (AMBULATORY_SURGERY_CENTER): Payer: Medicare Other

## 2023-02-25 VITALS — Ht 64.0 in | Wt 145.0 lb

## 2023-02-25 DIAGNOSIS — H10023 Other mucopurulent conjunctivitis, bilateral: Secondary | ICD-10-CM | POA: Diagnosis not present

## 2023-02-25 DIAGNOSIS — Z1211 Encounter for screening for malignant neoplasm of colon: Secondary | ICD-10-CM

## 2023-02-25 DIAGNOSIS — R531 Weakness: Secondary | ICD-10-CM | POA: Diagnosis not present

## 2023-02-25 DIAGNOSIS — S83242D Other tear of medial meniscus, current injury, left knee, subsequent encounter: Secondary | ICD-10-CM | POA: Diagnosis not present

## 2023-02-25 MED ORDER — NA SULFATE-K SULFATE-MG SULF 17.5-3.13-1.6 GM/177ML PO SOLN: 1.0000 | Freq: Once | ORAL | 0 refills | Status: AC

## 2023-02-25 NOTE — Progress Notes (Signed)
No egg or soy allergy known to patient  No issues known to pt with past sedation with any surgeries or procedures: PONV  Patient denies ever being told they had issues or difficulty with intubation  No FH of Malignant Hyperthermia Pt is not on diet pills Pt is not on  home 02  Pt is not on blood thinners  Pt denies issues with constipation  No A fib or A flutter Have any cardiac testing pending--no  LOA: independent  Prep: suprep   Patient's chart reviewed by Cathlyn Parsons CNRA prior to previsit and patient appropriate for the [facility].  Previsit completed and red dot placed by patient's name on their procedure day (on provider's schedule).     PV competed with patient. Prep instructions sent via mychart and home address. Goodrx coupon for [employer] provided to use for price reduction if needed.

## 2023-03-04 DIAGNOSIS — S83242D Other tear of medial meniscus, current injury, left knee, subsequent encounter: Secondary | ICD-10-CM | POA: Diagnosis not present

## 2023-03-04 DIAGNOSIS — R531 Weakness: Secondary | ICD-10-CM | POA: Diagnosis not present

## 2023-03-06 DIAGNOSIS — R531 Weakness: Secondary | ICD-10-CM | POA: Diagnosis not present

## 2023-03-06 DIAGNOSIS — S83242D Other tear of medial meniscus, current injury, left knee, subsequent encounter: Secondary | ICD-10-CM | POA: Diagnosis not present

## 2023-03-10 ENCOUNTER — Other Ambulatory Visit: Payer: Self-pay | Admitting: Hematology and Oncology

## 2023-03-11 DIAGNOSIS — R531 Weakness: Secondary | ICD-10-CM | POA: Diagnosis not present

## 2023-03-11 DIAGNOSIS — S83242D Other tear of medial meniscus, current injury, left knee, subsequent encounter: Secondary | ICD-10-CM | POA: Diagnosis not present

## 2023-03-18 DIAGNOSIS — R531 Weakness: Secondary | ICD-10-CM | POA: Diagnosis not present

## 2023-03-18 DIAGNOSIS — S83242D Other tear of medial meniscus, current injury, left knee, subsequent encounter: Secondary | ICD-10-CM | POA: Diagnosis not present

## 2023-03-20 DIAGNOSIS — R531 Weakness: Secondary | ICD-10-CM | POA: Diagnosis not present

## 2023-03-20 DIAGNOSIS — S83242D Other tear of medial meniscus, current injury, left knee, subsequent encounter: Secondary | ICD-10-CM | POA: Diagnosis not present

## 2023-03-27 ENCOUNTER — Encounter: Payer: Self-pay | Admitting: Gastroenterology

## 2023-03-27 ENCOUNTER — Ambulatory Visit: Payer: Medicare Other | Admitting: Gastroenterology

## 2023-03-27 VITALS — BP 122/57 | HR 67 | Temp 98.1°F | Resp 12 | Ht 64.0 in | Wt 145.0 lb

## 2023-03-27 DIAGNOSIS — Z1211 Encounter for screening for malignant neoplasm of colon: Secondary | ICD-10-CM

## 2023-03-27 DIAGNOSIS — D125 Benign neoplasm of sigmoid colon: Secondary | ICD-10-CM

## 2023-03-27 MED ORDER — SODIUM CHLORIDE 0.9 % IV SOLN: 500.0000 mL | Freq: Once | INTRAVENOUS | Status: DC

## 2023-03-27 NOTE — Progress Notes (Signed)
Called to room to assist during endoscopic procedure.  Patient ID and intended procedure confirmed with present staff. Received instructions for my participation in the procedure from the performing physician.  

## 2023-03-27 NOTE — Progress Notes (Signed)
Patient tachycardic  gave esmolol to break

## 2023-03-27 NOTE — Progress Notes (Signed)
History & Physical  Primary Care Physician:  Tally Joe, MD Primary Gastroenterologist: Claudette Head, MD  Impression / Plan:  Average risk CRC screening for colonoscopy  CHIEF COMPLAINT:  CRC screening   HPI: Diane Obrien is a 72 y.o. female average risk CRC screening for colonoscopy.    Past Medical History:  Diagnosis Date   Arthritis    knees   Breast cancer (HCC) 10/11/2021   GERD (gastroesophageal reflux disease)    Hyperlipidemia    Melanoma (HCC) 06/10/2010   nose   Personal history of radiation therapy    PONV (postoperative nausea and vomiting)    Vertigo     Past Surgical History:  Procedure Laterality Date   ANAL FISSURE REPAIR      x2   AUGMENTATION MAMMAPLASTY Bilateral    removed 2021   BREAST LUMPECTOMY WITH RADIOACTIVE SEED LOCALIZATION Right 12/07/2021   Procedure: RIGHT BREAST LUMPECTOMY WITH RADIOACTIVE SEED LOCALIZATION;  Surgeon: Griselda Miner, MD;  Location: [facility];  Service: General;  Laterality: Right;   CESAREAN SECTION  1988, 1990   COLONOSCOPY  05/2012   Dr Arlyce Dice   HEMORRHOIDECTOMY WITH HEMORRHOID BANDING     RE-EXCISION OF BREAST LUMPECTOMY Right 01/04/2022   Procedure: RE-EXCISION RIGHT BREAST INFERIOR MARGINS;  Surgeon: Griselda Miner, MD;  Location: [facility];  Service: General;  Laterality: Right;    Prior to Admission medications   Medication Sig Start Date End Date Taking? Authorizing Provider  azelastine (OPTIVAR) 0.05 % ophthalmic solution    Yes [provider]  loteprednol (LOTEMAX) 0.5 % ophthalmic suspension SMARTSIG:In Eye(s) 02/20/23  Yes [provider]  moxifloxacin (VIGAMOX) 0.5 % ophthalmic solution Apply 1 drop to eye. 02/20/23  Yes [provider]  tamoxifen (NOLVADEX) 10 MG tablet TAKE 1/2 TABLET BY MOUTH DAILY 03/10/23  Yes Iruku, Burnice Logan, MD  acetaminophen (TYLENOL) 325 MG tablet Take 650 mg by mouth every 6 (six) hours as needed. 10/13/19    [provider]  Biotin 5000 MCG CAPS Take 1 capsule by mouth daily.    [provider]  meclizine (ANTIVERT) 25 MG tablet Take by mouth. Patient not taking: Reported on 07/04/2022 11/08/21   [provider]  Multiple Vitamins-Minerals (MULTIVITAMIN WITH MINERALS) tablet Take 1 tablet by mouth daily.    [provider]    Current Outpatient Medications  Medication Sig Dispense Refill   azelastine (OPTIVAR) 0.05 % ophthalmic solution      loteprednol (LOTEMAX) 0.5 % ophthalmic suspension SMARTSIG:In Eye(s)     moxifloxacin (VIGAMOX) 0.5 % ophthalmic solution Apply 1 drop to eye.     tamoxifen (NOLVADEX) 10 MG tablet TAKE 1/2 TABLET BY MOUTH DAILY 90 tablet 3   acetaminophen (TYLENOL) 325 MG tablet Take 650 mg by mouth every 6 (six) hours as needed.     Biotin 5000 MCG CAPS Take 1 capsule by mouth daily.     meclizine (ANTIVERT) 25 MG tablet Take by mouth. (Patient not taking: Reported on 07/04/2022)     Multiple Vitamins-Minerals (MULTIVITAMIN WITH MINERALS) tablet Take 1 tablet by mouth daily.     Current Facility-Administered Medications  Medication Dose Route Frequency Provider Last Rate Last Admin   0.9 %  sodium chloride infusion  500 mL Intravenous Once Meryl Dare, MD        Allergies as of 03/27/2023 - Review Complete 03/27/2023  Allergen Reaction Noted   Other  11/15/2021   Pravastatin sodium Other (See Comments) 11/08/2021  Sulfa antibiotics Swelling 11/29/2021    Family History  Problem Relation Age of Onset   Breast cancer Mother        age 71 or 25   Ovarian cancer Maternal Grandmother    Colon cancer Neg Hx    Stomach cancer Neg Hx    Colon polyps Neg Hx    Esophageal cancer Neg Hx    Rectal cancer Neg Hx     Social History   Socioeconomic History   Marital status: Married    Spouse name: Not on file   Number of children: Not on file   Years of education: Not on file   Highest education level: Not on file   Occupational History   Not on file  Tobacco Use   Smoking status: Former    Current packs/day: 0.00    Types: Cigarettes    Quit date: 05/15/1983    Years since quitting: 39.8   Smokeless tobacco: Not on file  Vaping Use   Vaping status: Never Used  Substance and Sexual Activity   Alcohol use: No   Drug use: No   Sexual activity: Yes    Birth control/protection: Post-menopausal  Other Topics Concern   Not on file  Social History Narrative   Not on file   Social Determinants of Health   Financial Resource Strain: Not on file  Food Insecurity: Not on file  Transportation Needs: Not on file  Physical Activity: Not on file  Stress: Not on file  Social Connections: Not on file  Intimate Partner Violence: Not on file    Review of Systems:  All systems reviewed were negative except where noted in HPI.   Physical Exam:  General:  Alert, well-developed, in NAD Head:  Normocephalic and atraumatic. Eyes:  Sclera clear, no icterus.   Conjunctiva pink. Ears:  Normal auditory acuity. Mouth:  No deformity or lesions.  Neck:  Supple; no masses. Lungs:  Clear throughout to auscultation.   No wheezes, crackles, or rhonchi.  Heart:  Regular rate and rhythm; no murmurs. Abdomen:  Soft, nondistended, nontender. No masses, hepatomegaly. No palpable masses.  Normal bowel sounds.    Rectal:  Deferred   Msk:  Symmetrical without gross deformities. Extremities:  Without edema. Neurologic:  Alert and  oriented x 4; grossly normal neurologically. Skin:  Intact without significant lesions or rashes. Psych:  Alert and cooperative. Normal mood and affect.   Venita Lick. Russella Dar  03/27/2023, 10:02 AM See Loretha Stapler, [facility], to contact our on call provider

## 2023-03-27 NOTE — Patient Instructions (Signed)
-   Resume previous diet - Continue present medications. - Await pathology results - Patient given educational handouts related to procedure.  YOU HAD AN ENDOSCOPIC PROCEDURE TODAY AT THE [facility]:   Refer to the procedure report that was given to you for any specific questions about what was found during the examination.  If the procedure report does not answer your questions, please call your gastroenterologist to clarify.  If you requested that your care partner not be given the details of your procedure findings, then the procedure report has been included in a sealed envelope for you to review at your convenience later.  YOU SHOULD EXPECT: Some feelings of bloating in the abdomen. Passage of more gas than usual.  Walking can help get rid of the air that was put into your GI tract during the procedure and reduce the bloating. If you had a lower endoscopy (such as a colonoscopy or flexible sigmoidoscopy) you may notice spotting of blood in your stool or on the toilet paper. If you underwent a bowel prep for your procedure, you may not have a normal bowel movement for a few days.  Please Note:  You might notice some irritation and congestion in your nose or some drainage.  This is from the oxygen used during your procedure.  There is no need for concern and it should clear up in a day or so.  SYMPTOMS TO REPORT IMMEDIATELY:  Following lower endoscopy (colonoscopy or flexible sigmoidoscopy):  Excessive amounts of blood in the stool  Significant tenderness or worsening of abdominal pains  Swelling of the abdomen that is new, acute  Fever of 100F or higher   For urgent or emergent issues, a gastroenterologist can be reached at any hour by calling [phone]. Do not use MyChart messaging for urgent concerns.    DIET:  We do recommend a small meal at first, but then you may proceed to your regular diet.  Drink plenty of fluids but you should avoid alcoholic beverages for 24  hours.  ACTIVITY:  You should plan to take it easy for the rest of today and you should NOT DRIVE or use heavy machinery until tomorrow (because of the sedation medicines used during the test).    FOLLOW UP: Our staff will call the number listed on your records the next business day following your procedure.  We will call around 7:15- 8:00 am to check on you and address any questions or concerns that you may have regarding the information given to you following your procedure. If we do not reach you, we will leave a message.     If any biopsies were taken you will be contacted by phone or by letter within the next 1-3 weeks.  Please call us at [phone] if you have not heard about the biopsies in 3 weeks.    SIGNATURES/CONFIDENTIALITY: You and/or your care partner have signed paperwork which will be entered into your electronic medical record.  These signatures attest to the fact that that the information above on your After Visit Summary has been reviewed and is understood.  Full responsibility of the confidentiality of this discharge information lies with you and/or your care-partner.

## 2023-03-27 NOTE — Progress Notes (Signed)
Vss nad trans to pacu

## 2023-03-27 NOTE — Progress Notes (Signed)
Pt's states no medical or surgical changes since previsit or office visit. 

## 2023-03-27 NOTE — Op Note (Signed)
[facility] Patient Name: Diane Obrien Procedure Date: 03/27/2023 10:03 AM MRN: [id number] Endoscopist: Meryl Dare , MD, [phone] Age: 72 Referring MD:  Date of Birth: [birth year] Gender: Female Account #: [id number] Procedure:                Colonoscopy Indications:              Screening for colorectal malignant neoplasm Medicines:                Monitored Anesthesia Care Procedure:                Pre-Anesthesia Assessment:                           - Prior to the procedure, a History and Physical                            was performed, and patient medications and                            allergies were reviewed. The patient's tolerance of                            previous anesthesia was also reviewed. The risks                            and benefits of the procedure and the sedation                            options and risks were discussed with the patient.                            All questions were answered, and informed consent                            was obtained. Prior Anticoagulants: The patient has                            taken no anticoagulant or antiplatelet agents. ASA                            Grade Assessment: II - A patient with mild systemic                            disease. After reviewing the risks and benefits,                            the patient was deemed in satisfactory condition to                            undergo the procedure.                           After obtaining informed consent, the colonoscope  was passed under direct vision. Throughout the                            procedure, the patient's blood pressure, pulse, and                            oxygen saturations were monitored continuously. The                            Olympus Scope SN: [id number] was introduced through                            the anus and advanced to the the cecum, identified                            by  appendiceal orifice and ileocecal valve. The                            ileocecal valve, appendiceal orifice, and rectum                            were photographed. The quality of the bowel                            preparation was excellent. The colonoscopy was                            performed without difficulty. The patient tolerated                            the procedure well. Scope In: 10:11:05 AM Scope Out: 10:24:29 AM Scope Withdrawal Time: 0 hours 9 minutes 50 seconds  Total Procedure Duration: 0 hours 13 minutes 24 seconds  Findings:                 The perianal and digital rectal examinations were                            normal.                           A 6 mm polyp was found in the sigmoid colon. The                            polyp was sessile. The polyp was removed with a                            cold snare. Resection and retrieval were complete.                           Internal hemorrhoids were found during                            retroflexion. The hemorrhoids were small and Grade  I (internal hemorrhoids that do not prolapse).                           The exam was otherwise without abnormality on                            direct and retroflexion views. Complications:            No immediate complications. Estimated blood loss:                            None. Estimated Blood Loss:     Estimated blood loss: none. Impression:               - One 6 mm polyp in the sigmoid colon, removed with                            a cold snare. Resected and retrieved.                           - Small internal hemorrhoids.                           - The examination was otherwise normal on direct                            and retroflexion views. Recommendation:           - Repeat colonoscopy after studies are complete for                            surveillance based on pathology results.                           - Patient has a contact  number available for                            emergencies. The signs and symptoms of potential                            delayed complications were discussed with the                            patient. Return to normal activities tomorrow.                            Written discharge instructions were provided to the                            patient.                           - Resume previous diet.                           - Continue present medications.                           -  Await pathology results. Meryl Dare, MD 03/27/2023 10:28:01 AM This report has been signed electronically.

## 2023-03-28 ENCOUNTER — Telehealth: Payer: Self-pay

## 2023-03-28 NOTE — Telephone Encounter (Signed)
  Follow up Call-     03/27/2023    9:44 AM  Call back number  Post procedure Call Back phone  # [phone]  Permission to leave phone message Yes     Patient questions:  Do you have a fever, pain , or abdominal swelling? No. Pain Score  0 *  Have you tolerated food without any problems? Yes.    Have you been able to return to your normal activities? Yes.    Do you have any questions about your discharge instructions: Diet   No. Medications  No. Follow up visit  No.  Do you have questions or concerns about your Care? No.  Actions: * If pain score is 4 or above: No action needed, pain <4.

## 2023-03-31 ENCOUNTER — Encounter: Payer: Self-pay | Admitting: Gastroenterology

## 2023-03-31 LAB — SURGICAL PATHOLOGY

## 2023-04-07 ENCOUNTER — Ambulatory Visit
Admission: RE | Admit: 2023-04-07 | Discharge: 2023-04-07 | Disposition: A | Discharge status: Home / Self Care | Payer: Medicare Other | Source: Ambulatory Visit | Attending: Obstetrics and Gynecology | Admitting: Obstetrics and Gynecology

## 2023-04-07 ENCOUNTER — Other Ambulatory Visit: Payer: Medicare Other

## 2023-04-07 DIAGNOSIS — M8588 Other specified disorders of bone density and structure, other site: Secondary | ICD-10-CM | POA: Diagnosis not present

## 2023-04-07 DIAGNOSIS — N958 Other specified menopausal and perimenopausal disorders: Secondary | ICD-10-CM | POA: Diagnosis not present

## 2023-04-07 DIAGNOSIS — E2839 Other primary ovarian failure: Secondary | ICD-10-CM | POA: Diagnosis not present

## 2023-04-08 DIAGNOSIS — D0511 Intraductal carcinoma in situ of right breast: Secondary | ICD-10-CM | POA: Diagnosis not present

## 2023-04-09 DIAGNOSIS — M25562 Pain in left knee: Secondary | ICD-10-CM | POA: Diagnosis not present

## 2023-04-10 DIAGNOSIS — Z87898 Personal history of other specified conditions: Secondary | ICD-10-CM | POA: Diagnosis not present

## 2023-04-10 DIAGNOSIS — D225 Melanocytic nevi of trunk: Secondary | ICD-10-CM | POA: Diagnosis not present

## 2023-04-10 DIAGNOSIS — D224 Melanocytic nevi of scalp and neck: Secondary | ICD-10-CM | POA: Diagnosis not present

## 2023-04-10 DIAGNOSIS — L578 Other skin changes due to chronic exposure to nonionizing radiation: Secondary | ICD-10-CM | POA: Diagnosis not present

## 2023-04-10 DIAGNOSIS — Z86018 Personal history of other benign neoplasm: Secondary | ICD-10-CM | POA: Diagnosis not present

## 2023-04-10 DIAGNOSIS — D2271 Melanocytic nevi of right lower limb, including hip: Secondary | ICD-10-CM | POA: Diagnosis not present

## 2023-04-10 DIAGNOSIS — Z85828 Personal history of other malignant neoplasm of skin: Secondary | ICD-10-CM | POA: Diagnosis not present

## 2023-04-10 DIAGNOSIS — L821 Other seborrheic keratosis: Secondary | ICD-10-CM | POA: Diagnosis not present

## 2023-04-10 DIAGNOSIS — D2262 Melanocytic nevi of left upper limb, including shoulder: Secondary | ICD-10-CM | POA: Diagnosis not present

## 2023-04-10 DIAGNOSIS — D2272 Melanocytic nevi of left lower limb, including hip: Secondary | ICD-10-CM | POA: Diagnosis not present

## 2023-04-10 DIAGNOSIS — L658 Other specified nonscarring hair loss: Secondary | ICD-10-CM | POA: Diagnosis not present

## 2023-04-23 DIAGNOSIS — J329 Chronic sinusitis, unspecified: Secondary | ICD-10-CM | POA: Diagnosis not present

## 2023-05-05 DIAGNOSIS — H52222 Regular astigmatism, left eye: Secondary | ICD-10-CM | POA: Diagnosis not present

## 2023-05-05 DIAGNOSIS — T1511XA Foreign body in conjunctival sac, right eye, initial encounter: Secondary | ICD-10-CM | POA: Diagnosis not present

## 2023-05-05 DIAGNOSIS — H2513 Age-related nuclear cataract, bilateral: Secondary | ICD-10-CM | POA: Diagnosis not present

## 2023-05-05 DIAGNOSIS — H353121 Nonexudative age-related macular degeneration, left eye, early dry stage: Secondary | ICD-10-CM | POA: Diagnosis not present

## 2023-05-05 DIAGNOSIS — H353112 Nonexudative age-related macular degeneration, right eye, intermediate dry stage: Secondary | ICD-10-CM | POA: Diagnosis not present

## 2023-05-05 DIAGNOSIS — H5203 Hypermetropia, bilateral: Secondary | ICD-10-CM | POA: Diagnosis not present

## 2023-05-05 DIAGNOSIS — H524 Presbyopia: Secondary | ICD-10-CM | POA: Diagnosis not present

## 2023-05-19 DIAGNOSIS — Z23 Encounter for immunization: Secondary | ICD-10-CM | POA: Diagnosis not present

## 2023-09-12 ENCOUNTER — Other Ambulatory Visit: Payer: Self-pay | Admitting: Family Medicine

## 2023-09-12 DIAGNOSIS — Z853 Personal history of malignant neoplasm of breast: Secondary | ICD-10-CM

## 2023-10-01 ENCOUNTER — Telehealth: Payer: Self-pay

## 2023-10-01 NOTE — Progress Notes (Unsigned)
 BRIEF ONCOLOGIC HISTORY:  Oncology History  DCIS (ductal carcinoma in situ) of breast  10/03/2021 Mammogram   Mammogram showed suspicious calcifications within the upper outer quadrant of the right breast for which stereotactic biopsy is recommended.    10/11/2021 Pathology Results   Biopsy from the right breast needle core biopsy showed DCIS with solid growth pattern, microcalcification and comedonecrosis, linear extent of DCIS 8 mm, nuclear grade 1, ER 100% positive strong staining PR 15% positive strong to moderate staining intensity.   11/29/2021 Initial Diagnosis   DCIS (ductal carcinoma in situ) of breast   01/04/2022 Pathology Results   Right breast lumpectomy showed DCIS, intermediate grade, 2.2 cm with lobular cancerization, no evidence of invasive carcinoma.  Repeat margin reexcision showed DCIS, intermediate grade, 1 cm, focally 0.1 cm from inferior margin.  Changes consistent with previous lumpectomy   01/04/2022 Cancer Staging   Staging form: Breast, AJCC 8th Edition - Clinical stage from 01/04/2022: Stage 0 (cTis (DCIS), cN0, cM0, ER+, PR+) - Signed by Percival Brace, NP on 07/04/2022   02/18/2022 - 03/18/2022 Radiation Therapy   Site Technique Total Dose (Gy) Dose per Fx (Gy) Completed Fx Beam Energies  Breast, Right: Breast_R 3D 42.56/42.56 2.66 16/16 10X, 6XFFF  Breast, Right: Breast_R_Bst specialPort 8/8 2 4/4 6E, 9E     03/2022 -  Anti-estrogen oral therapy   Tamoxifen      INTERVAL HISTORY:   Discussed the use of AI scribe software for clinical note transcription with the patient, who gave verbal consent to proceed.  History of Present Illness Diane Obrien is a 73 year old female with osteoporosis who presents for a follow-up on bone health and medication management.  She has been taking tamoxifen , initially in the evenings, but switched to mornings to ensure consistency. She takes half of a 10 mg tablet daily, which she finds easier to remember in the  morning.  A recent bone density test indicated osteoporosis, ordered by Dr Leslee Rase. She has been drinking milk to address this but is unsure about the appropriate vitamin supplementation. She started a new vitamin supplement a week ago, taking one pill to test its compatibility with tamoxifen .  Her bone density has worsened over the past five years, progressing from normal in 2018 to osteoporosis in 2024. She is not currently on medications like Fosamax or Boniva for osteoporosis and has not received any recommendations since her last bone density test.  She experiences a vaginal discharge, which she attributes to tamoxifen , and has no bleeding. She also reports tenderness in a post-operative area and difficulty cleaning her inverted nipples, which have been an issue since her breast implants were removed. She has noticed that intercourse is extremely painful and she wonders if she can use something.  She has resumed full-time work and plans to restart weight-bearing exercises to manage her osteoporosis and weight gain.  Rest of the pertinent 10 point ROS reviewed and negative.  REVIEW OF SYSTEMS:  Review of Systems  Constitutional:  Negative for appetite change, chills, fatigue, fever and unexpected weight change.  HENT:   Negative for hearing loss, lump/mass and trouble swallowing.   Eyes:  Negative for eye problems and icterus.  Respiratory:  Negative for chest tightness, cough and shortness of breath.   Cardiovascular:  Negative for chest pain, leg swelling and palpitations.  Gastrointestinal:  Negative for abdominal distention, abdominal pain, constipation, diarrhea, nausea and vomiting.  Endocrine: Negative for hot flashes.  Genitourinary:  Negative for difficulty urinating.   Musculoskeletal:  Negative for arthralgias.  Skin:  Negative for itching and rash.  Neurological:  Negative for dizziness, extremity weakness, headaches and numbness.  Hematological:  Negative for adenopathy.  Does not bruise/bleed easily.  Psychiatric/Behavioral:  Negative for depression. The patient is not nervous/anxious.     PAST MEDICAL/SURGICAL HISTORY:  Past Medical History:  Diagnosis Date   Arthritis    knees   Breast cancer (HCC) 10/11/2021   GERD (gastroesophageal reflux disease)    Hyperlipidemia    Melanoma (HCC) 06/10/2010   nose   Personal history of radiation therapy    PONV (postoperative nausea and vomiting)    Vertigo    Past Surgical History:  Procedure Laterality Date   ANAL FISSURE REPAIR      x2   AUGMENTATION MAMMAPLASTY Bilateral    removed 2021   BREAST LUMPECTOMY WITH RADIOACTIVE SEED LOCALIZATION Right 12/07/2021   Procedure: RIGHT BREAST LUMPECTOMY WITH RADIOACTIVE SEED LOCALIZATION;  Surgeon: Caralyn Chandler, MD;  Location: [facility];  Service: General;  Laterality: Right;   CESAREAN SECTION  1988, 1990   COLONOSCOPY  05/2012   Dr Arvie Latus   HEMORRHOIDECTOMY WITH HEMORRHOID BANDING     RE-EXCISION OF BREAST LUMPECTOMY Right 01/04/2022   Procedure: RE-EXCISION RIGHT BREAST INFERIOR MARGINS;  Surgeon: Caralyn Chandler, MD;  Location: [facility];  Service: General;  Laterality: Right;     ALLERGIES:  Allergies  Allergen Reactions   Other    Pravastatin Sodium Other (See Comments)   Sulfa Antibiotics Swelling     CURRENT MEDICATIONS:  Outpatient Encounter Medications as of 10/02/2023  Medication Sig   acetaminophen  (TYLENOL ) 325 MG tablet Take 650 mg by mouth every 6 (six) hours as needed.   azelastine (OPTIVAR) 0.05 % ophthalmic solution    Biotin 5000 MCG CAPS Take 1 capsule by mouth daily.   loteprednol (LOTEMAX) 0.5 % ophthalmic suspension SMARTSIG:In Eye(s)   meclizine (ANTIVERT) 25 MG tablet Take by mouth. (Patient not taking: Reported on 07/04/2022)   moxifloxacin (VIGAMOX) 0.5 % ophthalmic solution Apply 1 drop to eye.   Multiple Vitamins-Minerals (MULTIVITAMIN WITH MINERALS) tablet Take 1 tablet by mouth daily.    tamoxifen  (NOLVADEX ) 10 MG tablet TAKE 1/2 TABLET BY MOUTH DAILY   No facility-administered encounter medications on file as of 10/02/2023.     ONCOLOGIC FAMILY HISTORY:  Family History  Problem Relation Age of Onset   Breast cancer Mother        age 21 or 79   Ovarian cancer Maternal Grandmother    Colon cancer Neg Hx    Stomach cancer Neg Hx    Colon polyps Neg Hx    Esophageal cancer Neg Hx    Rectal cancer Neg Hx      SOCIAL HISTORY:  Social History   Socioeconomic History   Marital status: Married    Spouse name: Not on file   Number of children: Not on file   Years of education: Not on file   Highest education level: Not on file  Occupational History   Not on file  Tobacco Use   Smoking status: Former    Current packs/day: 0.00    Types: Cigarettes    Quit date: 05/15/1983    Years since quitting: 40.4   Smokeless tobacco: Not on file  Vaping Use   Vaping status: Never Used  Substance and Sexual Activity   Alcohol use: No   Drug use: No   Sexual activity: Yes    Birth control/protection:  Post-menopausal  Other Topics Concern   Not on file  Social History Narrative   Not on file   Social Drivers of Health   Financial Resource Strain: Not on file  Food Insecurity: Not on file  Transportation Needs: Not on file  Physical Activity: Not on file  Stress: Not on file  Social Connections: Not on file  Intimate Partner Violence: Not on file     OBSERVATIONS/OBJECTIVE:  There were no vitals taken for this visit. GENERAL: Patient is a well appearing female in no acute distress BREAST EXAM: Right breast status postlumpectomy and radiation no sign of local recurrence left breast is benign. Bilaterally chronically inverted nipples.   LABORATORY DATA:  None for this visit.  DIAGNOSTIC IMAGING:  None for this visit.   ASSESSMENT AND PLAN:   Ms.. Apo is a pleasant 73 y.o. 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  Mammogram scheduled for 10/08/2022. No concerns on breast exam, post op changes noted in the right breast and chronically inverted nipples  Assessment and Plan Assessment & Plan Breast cancer - On tamoxifen  with discharge attributed to medication.  - She is tolerating it very well. No concerns on breast exam. - No palpable masses or regional adenopathy - Mammogram scheduled for May 2025.  Osteoporosis Osteoporosis in radius, osteopenia in spine and femur. Prefers lifestyle modifications over medications. - Recommend vitamin D and calcium supplementation. - Encourage weight-bearing exercises. - Monitor bone density.  Vaginal dryness Prescribed estrace  to be used twice or thrice weekly  She understands there is small amount of systemic absorption of estrogen. -ok to continue using while on tamoxifen .  RTC in 1 yr.  Total encounter time:20 minutes*in face-to-face visit time, chart review, lab review, care coordination, order entry, and documentation of the encounter time.  *Total Encounter Time as defined by the Centers for Medicare and Medicaid Services includes, in addition to the face-to-face time of a patient visit (documented in the note above) non-face-to-face time: obtaining and reviewing outside history, ordering and reviewing medications, tests or procedures, care coordination (communications with other health care professionals or caregivers) and documentation in the medical record.

## 2023-10-01 NOTE — Telephone Encounter (Signed)
 Spoke with patient and confirmed appointment on 4/24

## 2023-10-02 ENCOUNTER — Inpatient Hospital Stay: Payer: Medicare Other | Attending: Hematology and Oncology | Admitting: Hematology and Oncology

## 2023-10-02 VITALS — BP 161/79 | HR 88 | Temp 98.0°F | Resp 16 | Wt 153.8 lb

## 2023-10-02 DIAGNOSIS — Z1721 Progesterone receptor positive status: Secondary | ICD-10-CM | POA: Diagnosis not present

## 2023-10-02 DIAGNOSIS — Z87891 Personal history of nicotine dependence: Secondary | ICD-10-CM | POA: Insufficient documentation

## 2023-10-02 DIAGNOSIS — M8589 Other specified disorders of bone density and structure, multiple sites: Secondary | ICD-10-CM | POA: Insufficient documentation

## 2023-10-02 DIAGNOSIS — Z79810 Long term (current) use of selective estrogen receptor modulators (SERMs): Secondary | ICD-10-CM | POA: Diagnosis not present

## 2023-10-02 DIAGNOSIS — Z923 Personal history of irradiation: Secondary | ICD-10-CM | POA: Insufficient documentation

## 2023-10-02 DIAGNOSIS — Z803 Family history of malignant neoplasm of breast: Secondary | ICD-10-CM | POA: Insufficient documentation

## 2023-10-02 DIAGNOSIS — D0511 Intraductal carcinoma in situ of right breast: Secondary | ICD-10-CM | POA: Diagnosis not present

## 2023-10-02 DIAGNOSIS — Z170 Estrogen receptor positive status [ER+]: Secondary | ICD-10-CM | POA: Diagnosis not present

## 2023-10-02 DIAGNOSIS — Z8041 Family history of malignant neoplasm of ovary: Secondary | ICD-10-CM | POA: Diagnosis not present

## 2023-10-02 MED ORDER — ESTRADIOL 0.1 MG/GM VA CREA: 1.0000 | TOPICAL_CREAM | VAGINAL | 6 refills | Status: AC

## 2023-10-16 ENCOUNTER — Encounter (HOSPITAL_COMMUNITY): Payer: Self-pay

## 2023-10-20 ENCOUNTER — Other Ambulatory Visit: Payer: Self-pay | Admitting: Hematology and Oncology

## 2023-10-20 DIAGNOSIS — Z853 Personal history of malignant neoplasm of breast: Secondary | ICD-10-CM

## 2023-10-22 ENCOUNTER — Other Ambulatory Visit: Payer: Self-pay | Admitting: Hematology and Oncology

## 2023-10-22 DIAGNOSIS — Z853 Personal history of malignant neoplasm of breast: Secondary | ICD-10-CM

## 2023-10-23 ENCOUNTER — Ambulatory Visit
Admission: RE | Admit: 2023-10-23 | Discharge: 2023-10-23 | Disposition: A | Discharge status: Home / Self Care | Source: Ambulatory Visit | Attending: Family Medicine | Admitting: Family Medicine

## 2023-10-23 DIAGNOSIS — Z853 Personal history of malignant neoplasm of breast: Secondary | ICD-10-CM

## 2023-11-22 ENCOUNTER — Ambulatory Visit
Admission: RE | Admit: 2023-11-22 | Discharge: 2023-11-22 | Disposition: A | Discharge status: Home / Self Care | Source: Ambulatory Visit

## 2023-11-22 ENCOUNTER — Other Ambulatory Visit: Payer: Self-pay

## 2023-11-22 VITALS — BP 166/71 | HR 98 | Temp 97.7°F | Resp 16 | Ht 64.0 in | Wt 154.0 lb

## 2023-11-22 DIAGNOSIS — L84 Corns and callosities: Secondary | ICD-10-CM | POA: Diagnosis not present

## 2023-11-22 NOTE — ED Triage Notes (Addendum)
 Pt presents with complaints of left foot pain x 1.5 weeks. Pt states she was walking barefoot in the house when she noticed the pain. Pain is located on the bottom of left foot below the pinky toe. Pt currently denies pain, sitting. Pain increases when bearing weight/walking on foot. Denies taking OTC medications PTA.

## 2023-11-22 NOTE — ED Provider Notes (Signed)
 [facility]    CSN: [id number] Arrival date & time: 11/22/23  1412      History   Chief Complaint Chief Complaint  Patient presents with   Foot Pain    HPI Diane Obrien is a 73 y.o. female.   HPI Pt presents today with concern of left foot discomfort that has been present for about a week and half ago She states the area is tender when walking barefoot and she can feel a bump on the plantar aspect  She denies known injuries to the area  She denies drainage, bleeding or swelling     Past Medical History:  Diagnosis Date   Arthritis    knees   Breast cancer (HCC) 10/11/2021   GERD (gastroesophageal reflux disease)    Hyperlipidemia    Melanoma (HCC) 06/10/2010   nose   Personal history of radiation therapy    PONV (postoperative nausea and vomiting)    Vertigo     Patient Active Problem List   Diagnosis Date Noted   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    Past Surgical History:  Procedure Laterality Date   ANAL FISSURE REPAIR      x2   AUGMENTATION MAMMAPLASTY Bilateral    removed 2021   BREAST LUMPECTOMY WITH RADIOACTIVE SEED LOCALIZATION Right 12/07/2021   Procedure: RIGHT BREAST LUMPECTOMY WITH RADIOACTIVE SEED LOCALIZATION;  Surgeon: Caralyn Chandler, MD;  Location: [facility];  Service: General;  Laterality: Right;   CESAREAN SECTION  1988, 1990   COLONOSCOPY  05/2012   Dr Arvie Latus   HEMORRHOIDECTOMY WITH HEMORRHOID BANDING     RE-EXCISION OF BREAST LUMPECTOMY Right 01/04/2022   Procedure: RE-EXCISION RIGHT BREAST INFERIOR MARGINS;  Surgeon: Caralyn Chandler, MD;  Location: [facility];  Service: General;  Laterality: Right;    OB History   No obstetric history on file.      Home Medications    Prior to Admission medications   Medication Sig Start Date End Date Taking? Authorizing Provider  acetaminophen  (TYLENOL ) 325 MG tablet Take 650 mg by mouth every 6 (six) hours as needed. 10/13/19    [provider]  azelastine (OPTIVAR) 0.05 % ophthalmic solution     [provider]  Biotin 5000 MCG CAPS Take 1 capsule by mouth daily.    [provider]  estradiol  (ESTRACE  VAGINAL) 0.1 MG/GM vaginal cream Place 1 Applicatorful vaginally 3 (three) times a week. 10/03/23   Iruku, Praveena, MD  loteprednol (LOTEMAX) 0.5 % ophthalmic suspension SMARTSIG:In Eye(s) 02/20/23   [provider]  meclizine (ANTIVERT) 25 MG tablet Take by mouth. Patient not taking: Reported on 07/04/2022 11/08/21   [provider]  moxifloxacin (VIGAMOX) 0.5 % ophthalmic solution Apply 1 drop to eye. 02/20/23   [provider]  Multiple Vitamins-Minerals (MULTIVITAMIN WITH MINERALS) tablet Take 1 tablet by mouth daily.    [provider]  tamoxifen  (NOLVADEX ) 10 MG tablet TAKE 1/2 TABLET BY MOUTH DAILY 03/10/23   Iruku, Praveena, MD    Family History Family History  Problem Relation Age of Onset   Breast cancer Mother        age 58 or 59   Ovarian cancer Maternal Grandmother    Colon cancer Neg Hx    Stomach cancer Neg Hx    Colon polyps Neg Hx    Esophageal cancer Neg Hx    Rectal cancer Neg Hx     Social History Social History  Tobacco Use   Smoking status: Former    Current packs/day: 0.00    Types: Cigarettes    Quit date: 05/15/1983    Years since quitting: 40.5  Vaping Use   Vaping status: Never Used  Substance Use Topics   Alcohol use: No   Drug use: No     Allergies   Other, Pravastatin sodium, and Sulfa antibiotics   Review of Systems Review of Systems   Physical Exam Triage Vital Signs ED Triage Vitals  Encounter Vitals Group     BP 11/22/23 1432 (!) 166/71     Girls Systolic BP Percentile --      Girls Diastolic BP Percentile --      Boys Systolic BP Percentile --      Boys Diastolic BP Percentile --      Pulse Rate 11/22/23 1432 98     Resp 11/22/23 1432 16     Temp 11/22/23 1432 97.7 F (36.5 C)     Temp  Source 11/22/23 1432 Oral     SpO2 11/22/23 1432 99 %     Weight 11/22/23 1432 154 lb (69.9 kg)     Height 11/22/23 1432 5' 4 (1.626 m)     Head Circumference --      Peak Flow --      Pain Score 11/22/23 1503 0     Pain Loc --      Pain Education --      Exclude from Growth Chart --    No data found.  Updated Vital Signs BP (!) 166/71 (BP Location: Right Arm) Comment: pt stated she has white coat syndrome  Pulse 98   Temp 97.7 F (36.5 C) (Oral)   Resp 16   Ht 5' 4 (1.626 m)   Wt 154 lb (69.9 kg)   SpO2 99%   BMI 26.43 kg/m   Visual Acuity Right Eye Distance:   Left Eye Distance:   Bilateral Distance:    Right Eye Near:   Left Eye Near:    Bilateral Near:     Physical Exam Vitals reviewed.  Constitutional:      General: She is awake.     Appearance: Normal appearance. She is well-developed and well-groomed.  HENT:     Head: Normocephalic and atraumatic.   Eyes:     General: Lids are normal. Gaze aligned appropriately.     Extraocular Movements: Extraocular movements intact.     Conjunctiva/sclera: Conjunctivae normal.   Pulmonary:     Effort: Pulmonary effort is normal.   Musculoskeletal:       Feet:  Feet:     Comments: Pt has hard corn present on plantar aspect of left foot. No signs of redness, increased warmth, drainage or bleeding today .   Neurological:     General: No focal deficit present.     Mental Status: She is alert and oriented to person, place, and time.     GCS: GCS eye subscore is 4. GCS verbal subscore is 5. GCS motor subscore is 6.     Cranial Nerves: No cranial nerve deficit, dysarthria or facial asymmetry.   Psychiatric:        Attention and Perception: Attention and perception normal.        Mood and Affect: Mood and affect normal.        Speech: Speech normal.        Behavior: Behavior normal. Behavior is cooperative.      UC Treatments / Results  Labs (all labs ordered are listed, but only abnormal results are  displayed) Labs Reviewed - No data to display  EKG   Radiology No results found.  Procedures Procedures (including critical care time)  Medications Ordered in UC Medications - No data to display  Initial Impression / Assessment and Plan / UC Course  I have reviewed the triage vital signs and the nursing notes.  Pertinent labs & imaging results that were available during my care of the patient were reviewed by me and considered in my medical decision making (see chart for details).      Final Clinical Impressions(s) / UC Diagnoses   Final diagnoses:  Hard corn   Pt presents today with concerns for left foot pain that has been present for approximately 1.5 weeks.  She reports concerns for a bump on the plantar aspect of the left foot and is not sure what this is.  Physical exam is notable for a corn on the plantar aspect of the left foot.  Reviewed findings with the patient and her spouse as well as potential solutions including but not limited to use over-the-counter medications, excision in clinic, follow up with Podiatry. Pt would like to try home measures first and if these are not beneficial will follow up with specialty. Follow up as needed.      Discharge Instructions      You were seen today for concerns of a bump on your left foot This appears most consistent with a corn and typically resolves with home measures I usually recommend keep the area moisturized with occlusive ointments such as Aquaphor or Vaseline. These help to soften the corn and hopefully reduce pain. You can also try over the counter corn removal medications as needed If these are not providing relief in 1-2 weeks I recommend follow up with a Podiatrist or here at Urgent Care for more definitive removal.       ED Prescriptions   None    PDMP not reviewed this encounter.   Emanii Bugbee, Pearla Bottom, PA-C 11/22/23 1525

## 2023-11-22 NOTE — Discharge Instructions (Addendum)
 You were seen today for concerns of a bump on your left foot This appears most consistent with a corn and typically resolves with home measures I usually recommend keep the area moisturized with occlusive ointments such as Aquaphor or Vaseline. These help to soften the corn and hopefully reduce pain. You can also try over the counter corn removal medications as needed If these are not providing relief in 1-2 weeks I recommend follow up with a Podiatrist or here at Urgent Care for more definitive removal.

## 2024-03-11 ENCOUNTER — Other Ambulatory Visit: Payer: Self-pay

## 2024-03-11 ENCOUNTER — Encounter (HOSPITAL_BASED_OUTPATIENT_CLINIC_OR_DEPARTMENT_OTHER): Payer: Self-pay

## 2024-03-11 ENCOUNTER — Emergency Department (HOSPITAL_BASED_OUTPATIENT_CLINIC_OR_DEPARTMENT_OTHER)

## 2024-03-11 ENCOUNTER — Emergency Department (HOSPITAL_BASED_OUTPATIENT_CLINIC_OR_DEPARTMENT_OTHER)
Admission: EM | Admit: 2024-03-11 | Discharge: 2024-03-11 | Disposition: A | Discharge status: Home / Self Care | Attending: Emergency Medicine | Admitting: Emergency Medicine

## 2024-03-11 DIAGNOSIS — Z853 Personal history of malignant neoplasm of breast: Secondary | ICD-10-CM | POA: Insufficient documentation

## 2024-03-11 DIAGNOSIS — R079 Chest pain, unspecified: Secondary | ICD-10-CM | POA: Insufficient documentation

## 2024-03-11 DIAGNOSIS — R1013 Epigastric pain: Secondary | ICD-10-CM | POA: Diagnosis not present

## 2024-03-11 LAB — CBC
HCT: 38.8 % (ref 36.0–46.0)
Hemoglobin: 12.9 g/dL (ref 12.0–15.0)
MCH: 30.7 pg (ref 26.0–34.0)
MCHC: 33.2 g/dL (ref 30.0–36.0)
MCV: 92.4 fL (ref 80.0–100.0)
Platelets: 266 K/uL (ref 150–400)
RBC: 4.2 MIL/uL (ref 3.87–5.11)
RDW: 12.8 % (ref 11.5–15.5)
WBC: 8.4 K/uL (ref 4.0–10.5)
nRBC: 0 % (ref 0.0–0.2)

## 2024-03-11 LAB — HEPATIC FUNCTION PANEL
ALT: 34 U/L (ref 0–44)
AST: 50 U/L — ABNORMAL HIGH (ref 15–41)
Albumin: 4.2 g/dL (ref 3.5–5.0)
Alkaline Phosphatase: 61 U/L (ref 38–126)
Bilirubin, Direct: 0.1 mg/dL (ref 0.0–0.2)
Indirect Bilirubin: 0.2 mg/dL — ABNORMAL LOW (ref 0.3–0.9)
Total Bilirubin: 0.3 mg/dL (ref 0.0–1.2)
Total Protein: 7 g/dL (ref 6.5–8.1)

## 2024-03-11 LAB — BASIC METABOLIC PANEL WITH GFR
Anion gap: 12 (ref 5–15)
BUN: 12 mg/dL (ref 8–23)
CO2: 25 mmol/L (ref 22–32)
Calcium: 9.3 mg/dL (ref 8.9–10.3)
Chloride: 102 mmol/L (ref 98–111)
Creatinine, Ser: 1.09 mg/dL — ABNORMAL HIGH (ref 0.44–1.00)
GFR, Estimated: 53 mL/min — ABNORMAL LOW (ref >60)
Glucose, Bld: 113 mg/dL — ABNORMAL HIGH (ref 70–99)
Potassium: 3.6 mmol/L (ref 3.5–5.1)
Sodium: 139 mmol/L (ref 135–145)

## 2024-03-11 LAB — LIPASE, BLOOD: Lipase: 35 U/L (ref 11–51)

## 2024-03-11 LAB — TROPONIN T, HIGH SENSITIVITY
Troponin T High Sensitivity: 24 ng/L — ABNORMAL HIGH (ref 0–19)
Troponin T High Sensitivity: 25 ng/L — ABNORMAL HIGH (ref 0–19)
Troponin T High Sensitivity: 25 ng/L — ABNORMAL HIGH (ref 0–19)

## 2024-03-11 MED ORDER — ALUM & MAG HYDROXIDE-SIMETH 200-200-20 MG/5ML PO SUSP
30.0000 mL | Freq: Once | ORAL | Status: AC
  Administered 2024-03-11: 30 mL via ORAL
  Filled 2024-03-11: qty 30

## 2024-03-11 NOTE — ED Notes (Signed)
 ED Provider at bedside.

## 2024-03-11 NOTE — ED Notes (Signed)
 Pt ambulated to restroom with steady gait, denies any CP or SOB.

## 2024-03-11 NOTE — ED Triage Notes (Signed)
 Pt states that she had a stressful episode at work and she started having some heartburn, pain in the chest, and pressure in the center of her chest. Denies any shortness of breath. States it hurts to take a deep breath.

## 2024-03-11 NOTE — ED Notes (Signed)
 Patient requesting warm blanket, 2 blanket give, no further request.

## 2024-03-11 NOTE — ED Provider Notes (Signed)
 Diane Obrien Provider Note   CSN: [id number] Arrival date & time: 03/11/24  1658     Patient presents with: Chest Pain   Diane Obrien is a 73 y.o. female.   Patient is a 73 year old female with a history of GERD, vertigo, hyperlipidemia, prior breast cancer who presents with chest pain.  She states that she had a stressful day at work and she was leaving and developed some burning in her epigastric area.  She had some belching as well.  She thought it was heartburn.  She has had similar symptoms in the past with heartburn.  She said that she was driving home and got lightheaded.  She pulled over and called her husband to pick her up.  They brought her here.  She says it is eased off now but she still feels a little bit of heartburn.  It does not radiate anywhere.  No radiation to the back, neck or shoulders.  No associated shortness of breath.  No nausea or vomiting.  No diaphoresis.  The triage note said that it was worse when taking deep breath but she denies this to me.  She denies any leg pain or swelling.  She says it seemed to be a little worse when she was walking around but has eased off now although she cannot really see that it was exertional.  No prior history of known heart disease.  No family history of early heart disease.  She is non-smoker.  She has a history of hyperlipidemia but no history that she knows of of hypertension or diabetes.       Prior to Admission medications   Medication Sig Start Date End Date Taking? Authorizing Provider  acetaminophen  (TYLENOL ) 325 MG tablet Take 650 mg by mouth every 6 (six) hours as needed. 10/13/19   [provider]  azelastine (OPTIVAR) 0.05 % ophthalmic solution     [provider]  Biotin 5000 MCG CAPS Take 1 capsule by mouth daily.    [provider]  estradiol  (ESTRACE  VAGINAL) 0.1 MG/GM vaginal cream Place 1 Applicatorful vaginally 3 (three) times a week.  10/03/23   Iruku, Praveena, MD  loteprednol (LOTEMAX) 0.5 % ophthalmic suspension SMARTSIG:In Eye(s) 02/20/23   [provider]  meclizine (ANTIVERT) 25 MG tablet Take by mouth. Patient not taking: Reported on 07/04/2022 11/08/21   [provider]  moxifloxacin (VIGAMOX) 0.5 % ophthalmic solution Apply 1 drop to eye. 02/20/23   [provider]  Multiple Vitamins-Minerals (MULTIVITAMIN WITH MINERALS) tablet Take 1 tablet by mouth daily.    [provider]  tamoxifen  (NOLVADEX ) 10 MG tablet TAKE 1/2 TABLET BY MOUTH DAILY 03/10/23   Iruku, Praveena, MD    Allergies: Other, Pravastatin sodium, and Sulfa antibiotics    Review of Systems  Constitutional:  Negative for chills, diaphoresis, fatigue and fever.  HENT:  Negative for congestion, rhinorrhea and sneezing.   Eyes: Negative.   Respiratory:  Negative for cough, chest tightness and shortness of breath.   Cardiovascular:  Positive for chest pain. Negative for leg swelling.  Gastrointestinal:  Negative for abdominal pain, diarrhea, nausea and vomiting.  Genitourinary:  Negative for difficulty urinating, flank pain and frequency.  Musculoskeletal:  Negative for arthralgias and back pain.  Skin:  Negative for rash.  Neurological:  Positive for light-headedness. Negative for speech difficulty, weakness, numbness and headaches.    Updated Vital Signs BP 138/65   Pulse 72   Temp 97.6 F (36.4  C)   Resp 15   Ht 5' 4 (1.626 m)   Wt 72.6 kg   SpO2 98%   BMI 27.46 kg/m   Physical Exam Constitutional:      Appearance: She is well-developed.  HENT:     Head: Normocephalic and atraumatic.  Eyes:     Pupils: Pupils are equal, round, and reactive to light.  Cardiovascular:     Rate and Rhythm: Normal rate and regular rhythm.     Heart sounds: Normal heart sounds.  Pulmonary:     Effort: Pulmonary effort is normal. No respiratory distress.     Breath sounds: Normal breath sounds. No wheezing or rales.   Chest:     Chest wall: No tenderness.  Abdominal:     General: Bowel sounds are normal.     Palpations: Abdomen is soft.     Tenderness: There is no abdominal tenderness. There is no guarding or rebound.  Musculoskeletal:        General: Normal range of motion.     Cervical back: Normal range of motion and neck supple.     Comments: No edema or calf tenderness  Lymphadenopathy:     Cervical: No cervical adenopathy.  Skin:    General: Skin is warm and dry.     Findings: No rash.  Neurological:     Mental Status: She is alert and oriented to person, place, and time.     (all labs ordered are listed, but only abnormal results are displayed) Labs Reviewed  BASIC METABOLIC PANEL WITH GFR - Abnormal; Notable for the following components:      Result Value   Glucose, Bld 113 (*)    Creatinine, Ser 1.09 (*)    GFR, Estimated 53 (*)    All other components within normal limits  HEPATIC FUNCTION PANEL - Abnormal; Notable for the following components:   AST 50 (*)    Indirect Bilirubin 0.2 (*)    All other components within normal limits  TROPONIN T, HIGH SENSITIVITY - Abnormal; Notable for the following components:   Troponin T High Sensitivity 24 (*)    All other components within normal limits  TROPONIN T, HIGH SENSITIVITY - Abnormal; Notable for the following components:   Troponin T High Sensitivity 25 (*)    All other components within normal limits  TROPONIN T, HIGH SENSITIVITY - Abnormal; Notable for the following components:   Troponin T High Sensitivity 25 (*)    All other components within normal limits  CBC  LIPASE, BLOOD    EKG: EKG Interpretation Date/Time:  Thursday March 11 2024 19:10:26 EDT Ventricular Rate:  75 PR Interval:  134 QRS Duration:  104 QT Interval:  414 QTC Calculation: 463 R Axis:   60  Text Interpretation: Sinus rhythm RSR' in V1 or V2, right VCD or RVH Confirmed by Lenor Hollering [phone]) on 03/11/2024 7:43:27 PM  Radiology: DG Chest 2  View Result Date: 03/11/2024 CLINICAL DATA:  chest pain EXAM: DG CHEST 2V COMPARISON:  Chest x-ray 09/06/2008 FINDINGS: The heart and mediastinal contours are within normal limits. No focal consolidation. No pulmonary edema. No pleural effusion. No pneumothorax. No acute osseous abnormality. IMPRESSION: No active cardiopulmonary disease. Electronically Signed   By: Morgane  Naveau M.D.   On: 03/11/2024 18:06     Procedures   Medications Ordered in the ED  alum & mag hydroxide-simeth (MAALOX/MYLANTA) 200-200-20 MG/5ML suspension 30 mL (30 mLs Oral Given 03/11/24 1802)  Medical Decision Making Amount and/or Complexity of Data Reviewed Labs: ordered. Radiology: ordered.  Risk OTC drugs.   This patient presents to the ED for concern of chest pain, this involves an extensive number of treatment options, and is a complaint that carries with it a high risk of complications and morbidity.  I considered the following differential and admission for this acute, potentially life threatening condition.  The differential diagnosis includes ACS, GERD, pulmonary edema, pneumothorax, PE, aortic dissection, cholecystitis, pancreatitis  MDM:    Patient is a 73 year old who presents with chest discomfort.  Her initial EKG showed some slight ST depression inferiorly.  No elevation.  Repeat EKG showed more normalized ST segments.  Her troponins are minimally elevated but flat across 3 checks.  Chest x-ray does not show any evidence of pulmonary edema or pneumonia.  Discussed with cardiologist on-call, Dr. Gail.  He did review the EKG and the troponins and felt that patient could likely be fine having close cardiology follow-up but if patient was uncomfortable with this, could admit for observation overnight.  Discussed options with patient.  I actually did let her know that I was leaning toward admitting her to the hospital for further cardiac evaluation but she fairly  adamantly wants to go home.  I did advise her that we could check for signs of a heart attack but cannot do further cardiac evaluation here in the ED such as checking for heart function or signs of blockages.  She and family knowledge these risk and she wants to go home.  I did have her ambulate around the ED and she did not have any return of symptoms.  She was discharged home in good condition.  Will put in a referral for urgent cardiology evaluation.  Advised her if she does not hear from the office in the next day or 2 to call the office for appointment.  Return precautions were given.  (Labs, imaging, consults)  Labs: I Ordered, and personally interpreted labs.  The pertinent results include: Mildly elevated troponins, minimally elevated creatinine, minimally elevated AST  Imaging Studies ordered: I ordered imaging studies including chest x-ray I independently visualized and interpreted imaging. I agree with the radiologist interpretation  Additional history obtained from family at bedside.  External records from outside source obtained and reviewed including history  Cardiac Monitoring: The patient was maintained on a cardiac monitor.  If on the cardiac monitor, I personally viewed and interpreted the cardiac monitored which showed an underlying rhythm of: Sinus rhythm  Reevaluation: After the interventions noted above, I reevaluated the patient and found that they have :improved  Social Determinants of Health:    Disposition: Discharged to home  Co morbidities that complicate the patient evaluation  Past Medical History:  Diagnosis Date   Arthritis    knees   Breast cancer (HCC) 10/11/2021   GERD (gastroesophageal reflux disease)    Hyperlipidemia    Melanoma (HCC) 06/10/2010   nose   Personal history of radiation therapy    PONV (postoperative nausea and vomiting)    Vertigo      Medicines Meds ordered this encounter  Medications   alum & mag hydroxide-simeth  (MAALOX/MYLANTA) 200-200-20 MG/5ML suspension 30 mL    I have reviewed the patients home medicines and have made adjustments as needed  Problem List / ED Course: Problem List Items Addressed This Visit   None Visit Diagnoses       Nonspecific chest pain    -  Primary  Relevant Orders   Ambulatory referral to Cardiology                Final diagnoses:  Nonspecific chest pain    ED Discharge Orders          Ordered    Ambulatory referral to Cardiology       Comments: If you have not heard from the Cardiology office within the next 72 hours please call [phone].   03/11/24 7690               Lenor Hollering, MD 03/11/24 2315

## 2024-03-11 NOTE — Discharge Instructions (Addendum)
 Make an appointment to have close follow-up with the cardiologist.  Return to the emergency room if you have any worsening symptoms.

## 2024-04-02 ENCOUNTER — Ambulatory Visit: Attending: Cardiology | Admitting: Cardiology

## 2024-04-02 ENCOUNTER — Encounter: Payer: Self-pay | Admitting: Cardiology

## 2024-04-02 VITALS — BP 158/86 | HR 84 | Ht 64.0 in | Wt 153.4 lb

## 2024-04-02 DIAGNOSIS — D0511 Intraductal carcinoma in situ of right breast: Secondary | ICD-10-CM | POA: Insufficient documentation

## 2024-04-02 DIAGNOSIS — R072 Precordial pain: Secondary | ICD-10-CM | POA: Diagnosis present

## 2024-04-02 DIAGNOSIS — R7989 Other specified abnormal findings of blood chemistry: Secondary | ICD-10-CM | POA: Diagnosis present

## 2024-04-02 DIAGNOSIS — E782 Mixed hyperlipidemia: Secondary | ICD-10-CM | POA: Diagnosis present

## 2024-04-02 MED ORDER — METOPROLOL TARTRATE 100 MG PO TABS
ORAL_TABLET | ORAL | 0 refills | Status: AC
Start: 2024-04-02 — End: ?

## 2024-04-02 NOTE — Progress Notes (Signed)
 Cardiology Office Note    Date:  04/04/2024  ID:  Diane Obrien, DOB Apr 01, 1951, MRN [id number] PCP:  Seabron Lenis, MD  Cardiologist:  None  Electrophysiologist:  None   Chief Complaint: Chest pain   History of Present Illness: .   Diane Obrien is a 73 y.o. female with visit-pertinent history of GERD, vertigo, hyperlipidemia, prior breast cancer.  [facility] to establish as a new patient for evaluation of chest pain.  On 03/11/2024 patient presented to [facility] ED with complaints of chest pain.  She reported that she had had a stressful day at work and while leaving developed a burning sensation in her epigastric area with associated belching.  She felt that this was heartburn.  Patient notes that she had similar symptoms in the past with heartburn.  She reports that she was driving home then suddenly became lightheaded, pulled over and called her husband to pick her up.  In the ED she reported that the sensation had eased off however she felt a little bit of heartburn, denied any radiation, shortness of breath, nausea or vomiting, denied any diaphoresis.  Patient's high sensitive troponin 24>>25>>25.  Case reviewed with Dr. Gail, cardiologist on-call, recommended close cardiology follow-up.  Today she presents regarding chest pain.  She reports that she has been doing very well overall, denies any further chest pain, tightness or pressure.  She denies any increased shortness of breath, lower extremity edema, orthopnea or PND.  She reports that she and her husband recently returned from [place] where they did a significant mount a walking and she tolerated very well.  Patient notes a history of GERD and was previously on Nexium as she had some prior chest discomfort that would typically resolve with drinking of water, reports that she has now restarted on Nexium.   Patient notes significant whitecoat hypertension, reports that she will check her blood pressure at  home and it is consistently less than 130/80 however anytime she presents to and doctors office her blood pressure will be significantly elevated.  She denies any history of type 2 diabetes mellitus.  She notes history of breast cancer treated in June 2023 she had radiation but no chemotherapy.  Patient denies any significant family medical history.  Labwork independently reviewed: 03/11/2024: Sodium 139, potassium 3.6, creatinine 1.09, AST 50, ALT 34, hemoglobin 12.9, hematocrit 38.8 ROS: .   Today she denies chest pain, shortness of breath, lower extremity edema, fatigue, palpitations, melena, hematuria, hemoptysis, diaphoresis, weakness, presyncope, syncope, orthopnea, and PND.  All other systems are reviewed and otherwise negative. Studies Reviewed: SABRA   EKG:  EKG is not ordered today.  EKG reviewed from 03/11/2024 indicated normal sinus rhythm, nonspecific T wave abnormality, RSR in V1 and V2. CV Studies: Cardiac studies reviewed are outlined and summarized above. Otherwise please see EMR for full report.      Current Reported Medications:.    Current Meds  Medication Sig   acetaminophen  (TYLENOL ) 325 MG tablet Take 650 mg by mouth every 6 (six) hours as needed.   azelastine (OPTIVAR) 0.05 % ophthalmic solution    Biotin 5000 MCG CAPS Take 1 capsule by mouth daily.   esomeprazole (NEXIUM) 40 MG capsule Take 40 mg by mouth daily.   estradiol  (ESTRACE  VAGINAL) 0.1 MG/GM vaginal cream Place 1 Applicatorful vaginally 3 (three) times a week.   loteprednol (LOTEMAX) 0.5 % ophthalmic suspension SMARTSIG:In Eye(s)   meclizine (ANTIVERT) 25 MG tablet Take by mouth.  metoprolol tartrate (LOPRESSOR) 100 MG tablet Take 1 tablet 2 hours before test.   moxifloxacin (VIGAMOX) 0.5 % ophthalmic solution Apply 1 drop to eye.   Multiple Vitamins-Minerals (MULTIVITAMIN WITH MINERALS) tablet Take 1 tablet by mouth daily.   tamoxifen  (NOLVADEX ) 10 MG tablet TAKE 1/2 TABLET BY MOUTH DAILY   Physical Exam:     VS:  BP (!) 158/86   Pulse 84   Ht 5' 4 (1.626 m)   Wt 153 lb 6.4 oz (69.6 kg)   SpO2 97%   BMI 26.33 kg/m    Wt Readings from Last 3 Encounters:  04/02/24 153 lb 6.4 oz (69.6 kg)  03/11/24 160 lb (72.6 kg)  11/22/23 154 lb (69.9 kg)    GEN: Well nourished, well developed in no acute distress NECK: No JVD; No carotid bruits CARDIAC: RRR, no murmurs, rubs, gallops RESPIRATORY:  Clear to auscultation without rales, wheezing or rhonchi  ABDOMEN: Soft, non-tender, non-distended EXTREMITIES:  No edema; No acute deformity     Asessement and Plan:.    Chest pain/elevated troponin level: Patient notes history of acid reflux typically resolved with drinking of water.  Patient presented to [facility] on 03/11/2024 after feeling an increased burning sensation in her chest.  She reports that she had had a significantly stressful day at work and on the way home had a burning sensation followed by significant dizziness and lightheadedness requiring her to pull over and have her husband come pick her up.  In the ED she had mildly elevated but flat troponins, recommended close follow-up with cardiology.  Today she reports that she has been doing well, denies any further chest pain, tightness or pressure, denies any increased shortness of breath.  Given previously mildly elevated but flat troponin would recommend further evaluation with coronary CTA and echocardiogram given history of radiation therapy.  Patient in agreement with this plan.  Patient will take metoprolol to tartrate 100 mg prior to coronary CTA, discussed need for nitroglycerin use during testing.  Reviewed ED precautions.  White coat hypertension: Patient reports history of whitecoat hypertension, reports her home blood pressures consistently less than 130/80.  Encouraged patient to continue monitoring and notify the office if consistently elevated at home.  Breast cancer: Patient was previously diagnosed with breast cancer in June  2023, reports that she underwent radiation however did not have chemotherapy.  Will check echocardiogram as noted above.  Hyperlipidemia: Patient reports that she was recently diagnosed with hyperlipidemia, is not on a statin medication at this time as she is working on diet and exercise.  Will plan to discuss further pending coronary CTA results.   Disposition: F/u with Jezel Basto, NP in 6-8 weeks.   Signed, Kashia Brossard D Tyric Rodeheaver, NP

## 2024-04-02 NOTE — Patient Instructions (Addendum)
 Medication Instructions:  Your physician recommends that you continue on your current medications as directed. Please refer to the Current Medication list given to you today.  *If you need a refill on your cardiac medications before your next appointment, please call your pharmacy*  Lab Work: NONE If you have labs (blood work) drawn today and your tests are completely normal, you will receive your results only by: MyChart Message (if you have MyChart) OR A paper copy in the mail If you have any lab test that is abnormal or we need to change your treatment, we will call you to review the results.  Testing/Procedures: Your physician has requested that you have an echocardiogram. Echocardiography is a painless test that uses sound waves to create images of your heart. It provides your doctor with information about the size and shape of your heart and how well your heart's chambers and valves are working. This procedure takes approximately one hour. There are no restrictions for this procedure. Please do NOT wear cologne, perfume, aftershave, or lotions (deodorant is allowed). Please arrive 15 minutes prior to your appointment time.  Please note: We ask at that you not bring children with you during ultrasound (echo/ vascular) testing. Due to room size and safety concerns, children are not allowed in the ultrasound rooms during exams. Our front office staff cannot provide observation of children in our lobby area while testing is being conducted. An adult accompanying a patient to their appointment will only be allowed in the ultrasound room at the discretion of the ultrasound technician under special circumstances. We apologize for any inconvenience   Follow-Up: At [facility], you and your health needs are our priority.  As part of our continuing mission to provide you with exceptional heart care, our providers are all part of one team.  This team includes your primary Cardiologist  (physician) and Advanced Practice Providers or APPs (Physician Assistants and Nurse Practitioners) who all work together to provide you with the care you need, when you need it.  Your next appointment:   6-8 wks  Provider:   Katlyn West, NP  We recommend signing up for the patient portal called MyChart.  Sign up information is provided on this After Visit Summary.  MyChart is used to connect with patients for Virtual Visits (Telemedicine).  Patients are able to view lab/test results, encounter notes, upcoming appointments, etc.  Non-urgent messages can be sent to your provider as well.   To learn more about what you can do with MyChart, go to [email or web address].   Other Instructions   Your cardiac CT will be scheduled at one of the below locations:    [facility] [address]  [place], [place] [address]   If scheduled at the [facility] at [address], please enter the parking lot using the [address] entrance and use the FREE valet service at the patient drop-off area. Enter the building and check-in with registration on the main floor.   Please follow these instructions carefully (unless otherwise directed):  An IV will be required for this test and Nitroglycerin will be given.    On the Night Before the Test: Be sure to Drink plenty of water. Do not consume any caffeinated/decaffeinated beverages or chocolate 12 hours prior to your test. Do not take any antihistamines 12 hours prior to your test.  On the Day of the Test: Drink plenty of water until 1 hour prior to the test. Do not  eat any food 1 hour prior to test. You may take your regular medications prior to the test.  Take metoprolol (Lopressor) two hours prior to test. If you take Furosemide/Hydrochlorothiazide/Spironolactone/Chlorthalidone, please HOLD on the morning of the test. Patients who wear a continuous glucose monitor MUST remove the device prior to  scanning. FEMALES- please wear underwire-free bra if available, avoid dresses & tight clothing         After the Test: Drink plenty of water. After receiving IV contrast, you may experience a mild flushed feeling. This is normal. On occasion, you may experience a mild rash up to 24 hours after the test. This is not dangerous. If this occurs, you can take Benadryl 25 mg, Zyrtec, Claritin, or Allegra and increase your fluid intake. (Patients taking Tikosyn should avoid Benadryl, and may take Zyrtec, Claritin, or Allegra) If you experience trouble breathing, this can be serious. If it is severe call 911 IMMEDIATELY. If it is mild, please call our office.  We will call to schedule your test 2-4 weeks out understanding that some insurance companies will need an authorization prior to the service being performed.   For more information and frequently asked questions, please visit our website : [email or web address]  For non-scheduling related questions, please contact the cardiac imaging nurse navigator should you have any questions/concerns: Cardiac Imaging Nurse Navigators Direct Office Dial: [phone]   For scheduling needs, including cancellations and rescheduling, please call [place], [phone].

## 2024-04-04 ENCOUNTER — Encounter: Payer: Self-pay | Admitting: Cardiology

## 2024-04-07 ENCOUNTER — Telehealth: Payer: Self-pay

## 2024-04-07 MED ORDER — TAMOXIFEN CITRATE 10 MG PO TABS: 5.0000 mg | ORAL_TABLET | Freq: Every day | ORAL | 3 refills | Status: AC

## 2024-04-07 NOTE — Telephone Encounter (Signed)
 Pt called to request refill on tamoxifen . Per MD, refill. Pt aware.

## 2024-04-27 ENCOUNTER — Ambulatory Visit: Admitting: Cardiology

## 2024-05-18 ENCOUNTER — Ambulatory Visit (HOSPITAL_COMMUNITY)

## 2024-05-21 ENCOUNTER — Ambulatory Visit (HOSPITAL_COMMUNITY)

## 2024-05-28 ENCOUNTER — Ambulatory Visit: Admitting: Cardiology

## 2024-06-11 ENCOUNTER — Ambulatory Visit (HOSPITAL_COMMUNITY)

## 2024-06-17 ENCOUNTER — Encounter (HOSPITAL_COMMUNITY): Payer: Self-pay

## 2024-07-16 ENCOUNTER — Ambulatory Visit (HOSPITAL_COMMUNITY)

## 2024-08-27 ENCOUNTER — Ambulatory Visit (HOSPITAL_COMMUNITY)

## 2024-10-01 ENCOUNTER — Ambulatory Visit: Admitting: Hematology and Oncology
